# Patient Record
Sex: Female | Born: 1975 | Race: White | Hispanic: No | Marital: Single | Smoking: Never smoker
Health system: Southern US, Community
[De-identification: ages and names within clinical notes are randomized; demographics above are authoritative.]

## PROBLEM LIST (undated history)

## (undated) DIAGNOSIS — A64 Unspecified sexually transmitted disease: Secondary | ICD-10-CM

## (undated) DIAGNOSIS — J302 Other seasonal allergic rhinitis: Secondary | ICD-10-CM

## (undated) DIAGNOSIS — G43909 Migraine, unspecified, not intractable, without status migrainosus: Secondary | ICD-10-CM

## (undated) DIAGNOSIS — R87619 Unspecified abnormal cytological findings in specimens from cervix uteri: Secondary | ICD-10-CM

## (undated) DIAGNOSIS — IMO0002 Reserved for concepts with insufficient information to code with codable children: Secondary | ICD-10-CM

## (undated) HISTORY — DX: Other seasonal allergic rhinitis: J30.2

## (undated) HISTORY — DX: Reserved for concepts with insufficient information to code with codable children: IMO0002

## (undated) HISTORY — DX: Unspecified abnormal cytological findings in specimens from cervix uteri: R87.619

## (undated) HISTORY — DX: Unspecified sexually transmitted disease: A64

## (undated) HISTORY — PX: OTHER SURGICAL HISTORY: SHX169

## (undated) HISTORY — DX: Migraine, unspecified, not intractable, without status migrainosus: G43.909

## (undated) HISTORY — PX: BREAST SURGERY: SHX581

---

## 2008-04-27 HISTORY — PX: CERVICAL BIOPSY  W/ LOOP ELECTRODE EXCISION: SUR135

## 2008-05-25 ENCOUNTER — Emergency Department (HOSPITAL_COMMUNITY): Admission: EM | Admit: 2008-05-25 | Discharge: 2008-05-25 | Payer: Self-pay | Admitting: Emergency Medicine

## 2012-08-17 ENCOUNTER — Encounter: Payer: Self-pay | Admitting: Obstetrics

## 2012-08-19 ENCOUNTER — Ambulatory Visit (INDEPENDENT_AMBULATORY_CARE_PROVIDER_SITE_OTHER): Payer: BC Managed Care – PPO | Admitting: Obstetrics

## 2012-08-19 ENCOUNTER — Encounter: Payer: Self-pay | Admitting: Obstetrics

## 2012-08-19 VITALS — BP 119/82 | HR 102 | Temp 98.3°F | Ht 60.0 in | Wt 112.0 lb

## 2012-08-19 DIAGNOSIS — N6323 Unspecified lump in the left breast, lower outer quadrant: Secondary | ICD-10-CM

## 2012-08-19 DIAGNOSIS — N63 Unspecified lump in unspecified breast: Secondary | ICD-10-CM

## 2012-08-19 NOTE — Patient Instructions (Signed)
Management of breast lumps.

## 2012-08-19 NOTE — Progress Notes (Signed)
Subjective:     Marisa Hawkins is a 37 y.o. female here for a routine exam.  Current complaints: Patient c/o breast lump- noticed on Sunday- Left  4 o'clock size of a pea.  .  Patient is S/P Breast Reduction several years ago.  Has not been doing monthly self breast exams.   Gynecologic History Patient's last menstrual period was 08/08/2012. Contraception: OCP (estrogen/progesterone) Last Pap: 01/2012. Results were: normal Last mammogram: never.   Obstetric History OB History   Grav Para Term Preterm Abortions TAB SAB Ect Mult Living                   The following portions of the patient's history were reviewed and updated as appropriate: allergies, current medications, past family history, past medical history, past social history, past surgical history and problem list.  Review of Systems Pertinent items are noted in HPI.    Objective:    Breasts: Inspection negative, No nipple retraction or dimpling, No axillary or supraclavicular adenopathy, Taught monthly breast self examination, positive findings: irregular, firm, mobile and non-tender nodule located on the left lower outer quadrant    Assessment:    Breast lump.   Plan:    Education reviewed: self breast exams.    [facility]

## 2012-08-22 ENCOUNTER — Other Ambulatory Visit: Payer: Self-pay | Admitting: Obstetrics

## 2012-08-22 DIAGNOSIS — N63 Unspecified lump in unspecified breast: Secondary | ICD-10-CM

## 2012-09-01 ENCOUNTER — Ambulatory Visit: Payer: BC Managed Care – PPO | Admitting: Obstetrics

## 2012-09-08 ENCOUNTER — Other Ambulatory Visit: Payer: Self-pay

## 2012-09-08 ENCOUNTER — Ambulatory Visit
Admission: RE | Admit: 2012-09-08 | Discharge: 2012-09-08 | Disposition: A | Discharge status: Home / Self Care | Payer: BC Managed Care – PPO | Source: Ambulatory Visit | Attending: Obstetrics | Admitting: Obstetrics

## 2012-09-08 DIAGNOSIS — N63 Unspecified lump in unspecified breast: Secondary | ICD-10-CM

## 2012-09-20 ENCOUNTER — Ambulatory Visit (INDEPENDENT_AMBULATORY_CARE_PROVIDER_SITE_OTHER): Payer: BC Managed Care – PPO | Admitting: Obstetrics

## 2012-09-20 ENCOUNTER — Encounter: Payer: Self-pay | Admitting: Obstetrics

## 2012-09-20 VITALS — BP 102/67 | HR 89 | Temp 97.8°F | Ht 60.0 in | Wt 114.0 lb

## 2012-09-20 DIAGNOSIS — N63 Unspecified lump in unspecified breast: Secondary | ICD-10-CM

## 2012-09-20 NOTE — Progress Notes (Signed)
.   Subjective:     Marisa Hawkins is a 37 y.o. female here for a follow up exam regarding the lump in her left breast.  She had a mammogram on 09/08/12.  Personal health questionnaire reviewed: yes.   Gynecologic History Patient's last menstrual period was 09/10/2012. Contraception: OCP (estrogen/progesterone) Last Pap: 01/2012. Results were: normal Last mammogram: 09/08/2012 . Results were: palpable oil cyst  Obstetric History OB History   Grav Para Term Preterm Abortions TAB SAB Ect Mult Living                   The following portions of the patient's history were reviewed and updated as appropriate: allergies, current medications, past family history, past medical history, past social history, past surgical history and problem list.  Review of Systems Pertinent items are noted in HPI.    Objective:    No exam performed today, Consult only.    Assessment:    Breast lump.  Benign.   Plan:    Annual exam next visit.

## 2012-09-21 ENCOUNTER — Encounter: Payer: Self-pay | Admitting: Obstetrics

## 2013-03-09 ENCOUNTER — Ambulatory Visit: Payer: BC Managed Care – PPO | Admitting: Obstetrics

## 2013-03-17 ENCOUNTER — Encounter: Payer: Self-pay | Admitting: Advanced Practice Midwife

## 2013-03-17 ENCOUNTER — Ambulatory Visit (INDEPENDENT_AMBULATORY_CARE_PROVIDER_SITE_OTHER): Payer: BC Managed Care – PPO | Admitting: Advanced Practice Midwife

## 2013-03-17 VITALS — BP 112/75 | HR 88 | Temp 97.5°F | Ht 60.0 in | Wt 120.6 lb

## 2013-03-17 DIAGNOSIS — Z01419 Encounter for gynecological examination (general) (routine) without abnormal findings: Secondary | ICD-10-CM

## 2013-03-17 DIAGNOSIS — Z833 Family history of diabetes mellitus: Secondary | ICD-10-CM

## 2013-03-17 DIAGNOSIS — Z9889 Other specified postprocedural states: Secondary | ICD-10-CM

## 2013-03-17 LAB — COMPREHENSIVE METABOLIC PANEL
Alkaline Phosphatase: 46 U/L (ref 39–117)
BUN: 7 mg/dL (ref 6–23)
Glucose, Bld: 86 mg/dL (ref 70–99)
Total Bilirubin: 0.4 mg/dL (ref 0.3–1.2)

## 2013-03-17 LAB — CBC WITH DIFFERENTIAL/PLATELET
Basophils Relative: 0 % (ref 0–1)
Eosinophils Absolute: 0 10*3/uL (ref 0.0–0.7)
HCT: 40.9 % (ref 36.0–46.0)
Hemoglobin: 13.8 g/dL (ref 12.0–15.0)
MCH: 30.6 pg (ref 26.0–34.0)
MCHC: 33.7 g/dL (ref 30.0–36.0)
Monocytes Absolute: 0.3 10*3/uL (ref 0.1–1.0)
Monocytes Relative: 6 % (ref 3–12)
Neutrophils Relative %: 63 % (ref 43–77)

## 2013-03-17 LAB — HDL CHOLESTEROL: HDL: 98 mg/dL (ref >39)

## 2013-03-17 LAB — TRIGLYCERIDES: Triglycerides: 50 mg/dL (ref <150)

## 2013-03-17 LAB — CHOLESTEROL, TOTAL: Cholesterol: 208 mg/dL — ABNORMAL HIGH (ref 0–200)

## 2013-03-17 LAB — HEMOGLOBIN A1C
Hgb A1c MFr Bld: 5.4 % (ref <5.7)
Mean Plasma Glucose: 108 mg/dL (ref <117)

## 2013-03-17 MED ORDER — DESOGESTREL-ETHINYL ESTRADIOL 0.15-0.02/0.01 MG (21/5) PO TABS: 1.0000 | ORAL_TABLET | Freq: Every day | ORAL | Status: DC

## 2013-03-17 NOTE — Progress Notes (Signed)
  Subjective:     Marisa Hawkins is a 37 y.o. female and is here for a comprehensive physical exam. The patient reports no problems.  Patient denies risk of STI. Reports family history of heart disease and DM.   History   Social History  . Marital Status: Single    Spouse Name: N/A    Number of Children: N/A  . Years of Education: N/A   Occupational History  . Not on file.   Social History Main Topics  . Smoking status: Never Smoker   . Smokeless tobacco: Not on file  . Alcohol Use: Yes     Comment: rare  . Drug Use: No  . Sexual Activity: Yes    Partners: Male    Birth Control/ Protection: OCP   Other Topics Concern  . Not on file   Social History Narrative  . No narrative on file   Health Maintenance  Topic Date Due  . Pap Smear  12/05/1995  . Tetanus/tdap  12/04/1996  . Influenza Vaccine  11/25/2012    The following portions of the patient's history were reviewed and updated as appropriate: allergies, current medications, past family history, past medical history, past social history, past surgical history and problem list.  Review of Systems A comprehensive review of systems was negative.   Objective:    BP 112/75  Pulse 88  Temp(Src) 97.5 F (36.4 C) (Oral)  Ht 5' (1.524 m)  Wt 120 lb 9.6 oz (54.704 kg)  BMI 23.55 kg/m2  LMP 03/08/2013 General appearance: alert and cooperative Head: Normocephalic, without obvious abnormality, atraumatic Ears: normal TM's and external ear canals both ears Nose: Nares normal. Septum midline. Mucosa normal. No drainage or sinus tenderness. Throat: lips, mucosa, and tongue normal; teeth and gums normal Neck: no adenopathy, no carotid bruit, no JVD, supple, symmetrical, trachea midline and thyroid not enlarged, symmetric, no tenderness/mass/nodules Back: symmetric, no curvature. ROM normal. No CVA tenderness. Lungs: clear to auscultation bilaterally Breasts: normal appearance, no masses or tenderness palpated benign mass,  no change, non-tender. Scaring around breast from reduction. Heart: regular rate and rhythm, S1, S2 normal, no murmur, click, rub or gallop Abdomen: soft, non-tender; bowel sounds normal; no masses,  no organomegaly Pelvic: cervix normal in appearance, external genitalia normal, no adnexal masses or tenderness, no cervical motion tenderness, rectovaginal septum normal, uterus normal size, shape, and consistency and vagina normal without discharge Extremities: extremities normal, atraumatic, no cyanosis or edema Lymph nodes: Cervical, supraclavicular, and axillary nodes normal. Neurologic: Alert and oriented X 3, normal strength and tone. Normal symmetric reflexes. Normal coordination and gait    Assessment:    Healthy female exam.  BCM: OCP Patient Active Problem List   Diagnosis Date Noted  . History of loop electrical excision procedure (LEEP) 03/17/2013  . Breast lump on left side at 4 o'clock position 08/19/2012  . Lump or mass in breast 08/19/2012  Lump in breast is benign Family history of DM and Heart Disease      Plan:    Reviewed supplements. Encouraged Omega 3s, Vitamin D and Calcium. Preconception Counseling Reviewed Pap today w/ HPV General Health Panel of labs today See After Visit Summary for Counseling Recommendations  Encouraged regular fitness and exercise  50 min spent with patient greater than 80% spent in counseling and coordination of care.   Diondra Pines Wilson Singer CNM

## 2013-03-18 LAB — TSH: TSH: 1.807 u[IU]/mL (ref 0.350–4.500)

## 2013-03-21 LAB — PAP IG AND HPV HIGH-RISK

## 2016-04-03 ENCOUNTER — Ambulatory Visit (INDEPENDENT_AMBULATORY_CARE_PROVIDER_SITE_OTHER): Payer: 59 | Admitting: Obstetrics and Gynecology

## 2016-04-03 ENCOUNTER — Encounter: Payer: Self-pay | Admitting: Obstetrics and Gynecology

## 2016-04-03 VITALS — BP 108/76 | HR 68 | Resp 16 | Ht 59.75 in | Wt 127.0 lb

## 2016-04-03 DIAGNOSIS — Z01419 Encounter for gynecological examination (general) (routine) without abnormal findings: Secondary | ICD-10-CM | POA: Diagnosis not present

## 2016-04-03 DIAGNOSIS — N6320 Unspecified lump in the left breast, unspecified quadrant: Secondary | ICD-10-CM | POA: Diagnosis not present

## 2016-04-03 MED ORDER — ETONOGESTREL-ETHINYL ESTRADIOL 0.12-0.015 MG/24HR VA RING: 1.0000 | VAGINAL_RING | VAGINAL | 3 refills | Status: DC

## 2016-04-03 NOTE — Progress Notes (Signed)
40 y.o. G0P0000 Single Caucasian female here for annual exam.    Has used condoms and the sponge for contraception.   Used birth control in the past and did well.  Used Mircette.  Hx migraines with aura as a teen ago. Now has headaches the first day of her cycle but not migraines and no aura.  Hx LEEP and states she is told her cervix is short.   Not interested in childbearing.  Declines STD testing.   Not interested in lab work.  PCP:   None  Patient's last menstrual period was 03/26/2016 (exact date).     Period Cycle (Days): 30 Period Duration (Days): 5 Period Pattern: Regular Menstrual Flow: Light Menstrual Control: Tampon, Thin pad Menstrual Control Change Freq (Hours): every 6 hours on heaviest day Dysmenorrhea: (!) Mild Dysmenorrhea Symptoms: Cramping, Headache     Sexually active: Yes.   female The current method of family planning is condoms everytime or sponge.    Exercising: No.   Smoker:  no  Health Maintenance: Pap:  2014 normal History of abnormal Pap:  Yes, Hx colposcopy and LEEP procedure 2010--paps normal since. MMG:  2014 normal--this was done due to cyst in Left breast. Colonoscopy:  n/a BMD:   n/a  Result  n/a TDaP:  2013 Gardasil:   N/A   Screening Labs:  Hb today: declines, Urine today: unable to void   reports that she has never smoked. She has never used smokeless tobacco. She reports that she drinks about 1.8 oz of alcohol per week . She reports that she does not use drugs.  Past Medical History:  Diagnosis Date  . Abnormal Pap smear    Hx colposcopy and LEEP procedure 2010--Pos HR HPV  . Migraines    Migraines as a teenager with aura  . Seasonal allergies   . 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 BREAST SURGERY     breast reduction  . CERVICAL BIOPSY  W/ LOOP ELECTRODE EXCISION  2010  . colposcopy and LEEP      No current outpatient prescriptions on file.    No current facility-administered medications for this visit.     Family History  Problem Relation Age of Onset  . Arthritis Mother   . Diabetes Mother   . Hyperlipidemia Mother   . Osteoarthritis Mother   . Heart disease Maternal Grandmother   . Cancer Maternal Grandfather   . Mental illness Paternal Grandmother   . Heart disease Paternal Grandfather     ROS:  Pertinent items are noted in HPI.  Otherwise, a comprehensive ROS was negative.  Exam:   BP 108/76 (BP Location: Right Arm, Patient Position: Sitting, Cuff Size: Normal)   Pulse 68   Resp 16   Ht 4' 11.75" (1.518 m)   Wt 127 lb (57.6 kg)   LMP 03/26/2016 (Exact Date)   BMI 25.01 kg/m     General appearance: alert, cooperative and appears stated age Head: Normocephalic, without obvious abnormality, atraumatic Neck: no adenopathy, supple, symmetrical, trachea midline and thyroid normal to inspection and palpation Lungs: clear to auscultation bilaterally Breasts: Consistent with bilateral reduction.  Left breast with 4 mm firm pea size nodule at 4- 5:00. Right breast normal.  Bilateral reduction. No tenderness, No nipple retraction or dimpling, No nipple discharge or bleeding, No axillary or supraclavicular adenopathy Heart: regular rate and rhythm Abdomen: soft, non-tender; no masses, no organomegaly Extremities: extremities  normal, atraumatic, no cyanosis or edema Skin: Skin color, texture, turgor normal. No rashes or lesions Lymph nodes: Cervical, supraclavicular, and axillary nodes normal. No abnormal inguinal nodes palpated Neurologic: Grossly normal  Pelvic: External genitalia:  no lesions              Urethra:  normal appearing urethra with no masses, tenderness or lesions              Bartholins and Skenes: normal                 Vagina: normal appearing vagina with normal color and discharge, no lesions              Cervix: no lesions              Pap taken: Yes.   Bimanual Exam:  Uterus:  normal size,  contour, position, consistency, mobility, non-tender              Adnexa: no mass, fullness, tenderness      Chaperone was present for exam.  Assessment:   Well woman visit with normal exam. Hx LEEP and positive HR HPV.  Hx migraines with aura as a teen. None since.  Left breast mass.  Hx bilateral breast reduction.   Plan: Dx bilateral mammogram and left breast ultrasound.  Recommended self breast exam.  Pap and HR HPV as above. Discussed Calcium, Vitamin D, regular exercise program including cardiovascular and weight bearing exercise. Rx for NuvaRing 3 with 3 refills. Instructed in use.  OK to use each ring for 28 days if desires amenorrhea. Follow up annually and prn.       After visit summary provided.

## 2016-04-03 NOTE — Patient Instructions (Signed)

## 2016-04-03 NOTE — Progress Notes (Signed)
Scheduled patient while in office for bilateral diagnostic mammogram [facility] on 04[facility]24/2016 at 2 pm. Patient is agreeable to date and time. Placed in mammogram hold.

## 2016-04-10 LAB — IPS PAP TEST WITH HPV

## 2016-04-24 ENCOUNTER — Other Ambulatory Visit: Payer: Self-pay

## 2016-05-01 ENCOUNTER — Ambulatory Visit
Admission: RE | Admit: 2016-05-01 | Discharge: 2016-05-01 | Disposition: A | Discharge status: Home / Self Care | Payer: 59 | Source: Ambulatory Visit | Attending: Obstetrics and Gynecology | Admitting: Obstetrics and Gynecology

## 2016-05-01 DIAGNOSIS — N6320 Unspecified lump in the left breast, unspecified quadrant: Secondary | ICD-10-CM

## 2017-03-01 ENCOUNTER — Other Ambulatory Visit: Payer: Self-pay | Admitting: Obstetrics and Gynecology

## 2017-03-01 NOTE — Telephone Encounter (Signed)
Medication refill request: Nuvaring  Last AEX:  04-03-16  Next AEX: 04-09-17  Last MMG (if hormonal medication request): 05-01-16 Stable 3 mm palpable subcutaneous oil cyst in the lower outer quadrant of the left breast. No evidence of malignancy bilaterally Refill authorized: please advise

## 2017-04-09 ENCOUNTER — Ambulatory Visit: Payer: 59 | Admitting: Obstetrics and Gynecology

## 2017-04-23 ENCOUNTER — Encounter: Payer: Self-pay | Admitting: Certified Nurse Midwife

## 2017-04-23 ENCOUNTER — Other Ambulatory Visit: Payer: Self-pay

## 2017-04-23 ENCOUNTER — Ambulatory Visit: Payer: 59 | Admitting: Certified Nurse Midwife

## 2017-04-23 VITALS — BP 108/68 | HR 82 | Resp 14 | Ht 59.75 in | Wt 135.0 lb

## 2017-04-23 DIAGNOSIS — Z01419 Encounter for gynecological examination (general) (routine) without abnormal findings: Secondary | ICD-10-CM | POA: Diagnosis not present

## 2017-04-23 DIAGNOSIS — Z3044 Encounter for surveillance of vaginal ring hormonal contraceptive device: Secondary | ICD-10-CM | POA: Diagnosis not present

## 2017-04-23 NOTE — Patient Instructions (Signed)

## 2017-04-23 NOTE — Progress Notes (Signed)
41 y.o. G0P0000 Single  Caucasian Fe here for annual exam. Nuvaring working well with continuous use for convenience. Some spotting at times, but not consistent. No partner change, no STD concerns or testing today.  Has not noted lump (oil cyst) in left breast recently. Does monthly SBE. Sees Urgent care if needed. No health issues today.  No LMP recorded. Patient is not currently having periods (Reason: Other).          Sexually active: Yes.    The current method of family planning is NuvaRing vaginal inserts.    Exercising: No.  The patient does not participate in regular exercise at present. Smoker:  no  Health Maintenance: Pap:  2014 neg, 04-03-16 neg HPV HR neg History of Abnormal Pap: yes with LEEP MMG:  05-01-16 category b density birads 2:neg left breast mass unchanged since 2014 Self Breast exams: yes Colonoscopy:  none BMD:   none TDaP:  2013 Shingles: never Pneumonia: never Hep C and HIV: never Labs: declined, wants to establish with PCP   reports that  has never smoked. she has never used smokeless tobacco. She reports that she drinks about 1.8 oz of alcohol per week. She reports that she does not use drugs.  Past Medical History:  Diagnosis Date  . Abnormal Pap smear    Hx colposcopy and LEEP procedure 2010--Pos HR HPV  . Migraines    Migraines as a teenager with aura  . Seasonal allergies   . 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 BREAST SURGERY     breast reduction  . CERVICAL BIOPSY  W/ LOOP ELECTRODE EXCISION  2010  . colposcopy and LEEP      Current Outpatient Medications  Medication Sig Dispense Refill  . NUVARING 0.12-0.015 MG/24HR vaginal ring PLACE 1 RING VAGINALLY AND LEAVE IN PLACE FOR 3 WEEKS THEN REMOVE FOR 1 WEEK 1 each 1   No current facility-administered medications for this visit.     Family History  Problem Relation Age of Onset  . Arthritis Mother   . Diabetes Mother   .  Hyperlipidemia Mother   . Osteoarthritis Mother   . Heart disease Maternal Grandmother   . Cancer Maternal Grandfather   . Mental illness Paternal Grandmother   . Heart disease Paternal Grandfather     ROS:  Pertinent items are noted in HPI.  Otherwise, a comprehensive ROS was negative.  Exam:   BP 108/68 (BP Location: Right Arm, Patient Position: Sitting, Cuff Size: Normal)   Pulse 82   Resp 14   Ht 4' 11.75" (1.518 m)   Wt 135 lb (61.2 kg)   BMI 26.59 kg/m  Height: 4' 11.75" (151.8 cm) Ht Readings from Last 3 Encounters:  04/23/17 4' 11.75" (1.518 m)  04/03/16 4' 11.75" (1.518 m)  03/17/13 5' (1.524 m)    General appearance: alert, cooperative and appears stated age Head: Normocephalic, without obvious abnormality, atraumatic Neck: no adenopathy, supple, symmetrical, trachea midline and thyroid normal to inspection and palpation Lungs: clear to auscultation bilaterally Breasts: normal appearance, no masses or tenderness, No nipple retraction or dimpling, No nipple discharge or bleeding, No axillary or supraclavicular adenopathy  No masses noted in  Previous area of concern and patient has not been able to feel either. Heart: regular rate and rhythm Abdomen: soft, non-tender; no masses,  no organomegaly Extremities: extremities normal, atraumatic, no cyanosis or edema Skin: Skin color, texture, turgor  normal. No rashes or lesions Lymph nodes: Cervical, supraclavicular, and axillary nodes normal. No abnormal inguinal nodes palpated Neurologic: Grossly normal   Pelvic: External genitalia:  no lesions              Urethra:  normal appearing urethra with no masses, tenderness or lesions              Bartholin's and Skene's: normal                 Vagina: normal appearing vagina with normal color and discharge, no lesions              Cervix: no cervical motion tenderness, no lesions and LEEP appearance              Pap taken: No. Bimanual Exam:  Uterus:  normal size, contour,  position, consistency, mobility, non-tender              Adnexa: normal adnexa and no mass, fullness, tenderness               Rectovaginal: Confirms               Anus:  normal sphincter tone, no lesions  Chaperone present: yes  A:  Well Woman with normal exam  Contraception Nuvaring desired  History of abnormal pap smear with LEEP  History of oil gland cyst in left breast not palpable today   P:   Reviewed health and wellness pertinent to exam  Discussed risks/benefits/warning signs of Nuvaring , desires continuance. Discussed having menstrual cycle every 3 rd month to see if BTB stops and will advise.  Rx Nuvaring see order with instructions.  Continue SBE and advise if any changes noted. Stressed mammogram yearly.  Pap smear: no   counseled on breast self exam, mammography screening, STD prevention, HIV risk factors and prevention, adequate intake of calcium and vitamin D, diet and exercise  return annually or prn  An After Visit Summary was printed and given to the patient.

## 2017-04-26 ENCOUNTER — Other Ambulatory Visit: Payer: Self-pay | Admitting: *Deleted

## 2017-04-26 NOTE — Telephone Encounter (Signed)
Medication refill request: nuvaring  Last AEX:  04/23/17 DL  Next AEX: 1/61/091/10/20 Last MMG (if hormonal medication request): 05-01-16 category b density birads 2:neg left breast mass unchanged since 2014 Refill authorized: 03/01/17 #1, 1RF. Today, please advise.    Detailed message left for patient to return call to verify which pharmacy she needed this medication sent to.

## 2017-04-28 MED ORDER — ETONOGESTREL-ETHINYL ESTRADIOL 0.12-0.015 MG/24HR VA RING: 1.0000 | VAGINAL_RING | VAGINAL | 11 refills | Status: DC

## 2017-04-28 NOTE — Telephone Encounter (Signed)
Spoke with patient. [facility] [address]

## 2017-07-05 ENCOUNTER — Telehealth: Payer: Self-pay | Admitting: Certified Nurse Midwife

## 2017-07-05 NOTE — Telephone Encounter (Signed)
Spoke with patient, advised as seen below per Dr. Edward JollySilva. Patient will leave nuvaring out until OV with Leota Sauerseborah Leonard, CNM. Is aware to use BUM if SA. Patient verbalizes understanding and is agreeable.   Routing to provider for final review. Patient is agreeable to disposition. Will close encounter.

## 2017-07-05 NOTE — Telephone Encounter (Signed)
Spoke with patient. Has been using Nuvaring for 4337yr. Experienced "aura with spotting and flash" this afternoon, no headache. Hx of migraines as a teenager 27 yrs ago. Denies any other GYN symptoms.   Symptoms have resolved, patient removed her Nuvaring 20 minutes ago, "was freaked out". OV scheduled for 3/15 at 10:30am with Leota Sauerseborah Leonard, CNM. Patient is SA, advised to use BUM. Advised patient will review with covering provider and return call with recommendations, patient is agreeable.   Dr. Edward JollySilva -please review and advise?  Cc: Leota Sauerseborah Leonard, CNM

## 2017-07-05 NOTE — Telephone Encounter (Signed)
I agree with removal of the Nuvaring.  Eunice BlaseDebbie can assess this further with her at her office visit.  She can decide if she needs to be seen by Neurology.   Cc- Sara Chuebbie Leonard

## 2017-07-05 NOTE — Telephone Encounter (Signed)
Patient was told to call if she started having migraines on Nuvoring.

## 2017-07-09 ENCOUNTER — Other Ambulatory Visit: Payer: Self-pay

## 2017-07-09 ENCOUNTER — Encounter: Payer: Self-pay | Admitting: Certified Nurse Midwife

## 2017-07-09 ENCOUNTER — Ambulatory Visit (INDEPENDENT_AMBULATORY_CARE_PROVIDER_SITE_OTHER): Payer: 59 | Admitting: Certified Nurse Midwife

## 2017-07-09 VITALS — BP 102/70 | HR 68 | Resp 16 | Ht 59.75 in | Wt 131.0 lb

## 2017-07-09 DIAGNOSIS — Z3041 Encounter for surveillance of contraceptive pills: Secondary | ICD-10-CM | POA: Diagnosis not present

## 2017-07-09 DIAGNOSIS — Z8669 Personal history of other diseases of the nervous system and sense organs: Secondary | ICD-10-CM

## 2017-07-09 MED ORDER — NORETHINDRONE 0.35 MG PO TABS: 1.0000 | ORAL_TABLET | Freq: Every day | ORAL | 4 refills | Status: DC

## 2017-07-09 NOTE — Patient Instructions (Signed)
Call back in month and give status report and if needed earllier

## 2017-07-09 NOTE — Progress Notes (Signed)
42 y.o. Single Caucasian G0P0000 here for evaluation of Nuvaring use started  more than year ago. History of migraine and aura as teen and had not had again. Patient experienced Aura only for about 10 minutes 2 weeks ago at work. Finally realized what it was,due to no occurrence in so long. Did not have headache with. Denied any numbness or speech issues with. 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 Here for contraception and aware progesterone only recommended. Patient has read information and prefers POP. Plans follow up with MD if aura occurs again. No other concerns today.  O: Healthy female, WD WN Affect: normal orientation X 3    A: History of Migraine headache with aura since teen, aura occurred recently with Nuvaring use, has discontinued with no sequelae. Contraception change desires POP  P: Discussed importance of aura follow up if there are reoccurrence and risk of stroke. Patient will follow up. Risks/benefits/warning signs/bleeding expectations with POP given. Requests Rx Rx Micronor see order with instructions and need to evaluate in 1 month after use or if problems. Start with next menses.Questions addressed.   18 minutes spent  of time spent in face to face counseling regarding contraception options and use.  RV prn as above

## 2018-05-06 ENCOUNTER — Other Ambulatory Visit: Payer: Self-pay

## 2018-05-06 ENCOUNTER — Encounter: Payer: Self-pay | Admitting: Certified Nurse Midwife

## 2018-05-06 ENCOUNTER — Ambulatory Visit: Payer: 59 | Admitting: Certified Nurse Midwife

## 2018-05-06 ENCOUNTER — Other Ambulatory Visit (HOSPITAL_COMMUNITY)
Admission: RE | Admit: 2018-05-06 | Discharge: 2018-05-06 | Disposition: A | Discharge status: Home / Self Care | Payer: 59 | Source: Ambulatory Visit | Attending: Obstetrics & Gynecology | Admitting: Obstetrics & Gynecology

## 2018-05-06 VITALS — BP 110/68 | HR 60 | Resp 16 | Ht 60.25 in | Wt 134.0 lb

## 2018-05-06 DIAGNOSIS — Z124 Encounter for screening for malignant neoplasm of cervix: Secondary | ICD-10-CM

## 2018-05-06 DIAGNOSIS — Z3041 Encounter for surveillance of contraceptive pills: Secondary | ICD-10-CM

## 2018-05-06 DIAGNOSIS — Z8742 Personal history of other diseases of the female genital tract: Secondary | ICD-10-CM

## 2018-05-06 DIAGNOSIS — Z01419 Encounter for gynecological examination (general) (routine) without abnormal findings: Secondary | ICD-10-CM

## 2018-05-06 MED ORDER — NORETHINDRONE 0.35 MG PO TABS: 1.0000 | ORAL_TABLET | Freq: Every day | ORAL | 4 refills | Status: DC

## 2018-05-06 NOTE — Progress Notes (Signed)
43 y.o. G0P0000 Single  Caucasian Fe here for annual exam. Periods scant to every 3 month for about one day to none with POP use. No other aura occurrence. Working well. No partner change or STD screening needed. No health changes. Declines labs today unless needed.   No LMP recorded. (Menstrual status: Oral contraceptives).          Sexually active: Yes.    The current method of family planning is oral progesterone-only contraceptive.    Exercising: No.  exercise Smoker:  no  Review of Systems  Constitutional: Negative.   HENT: Negative.   Eyes: Negative.   Respiratory: Negative.   Cardiovascular: Negative.   Gastrointestinal: Negative.   Genitourinary: Negative.   Musculoskeletal: Negative.   Skin: Negative.   Neurological: Negative.   Endo/Heme/Allergies: Negative.   Psychiatric/Behavioral: Negative.     Health Maintenance: Pap:  04-03-16 HPV HR neg HPV HR neg History of Abnormal Pap: yes LEEP MMG:  05-01-16 category b density birads 2:neg Self Breast exams: occ Colonoscopy:  none BMD:   none TDaP:  2013 Shingles: no Pneumonia: no Hep C and HIV: not done Labs: if needed.   reports that she has never smoked. She has never used smokeless tobacco. She reports current alcohol use of about 3.0 standard drinks of alcohol per week. She reports that she does not use drugs.  Past Medical History:  Diagnosis Date  . Abnormal Pap smear    Hx colposcopy and LEEP procedure 2010--Pos HR HPV  . Migraines    Migraines as a teenager with aura  . Seasonal allergies   . 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 BREAST SURGERY     breast reduction  . CERVICAL BIOPSY  W/ LOOP ELECTRODE EXCISION  2010  . colposcopy and LEEP      Current Outpatient Medications  Medication Sig Dispense Refill  . norethindrone (MICRONOR,CAMILA,ERRIN) 0.35 MG tablet Take 1 tablet (0.35 mg total) by mouth daily. 3 Package 4   No current  facility-administered medications for this visit.     Family History  Problem Relation Age of Onset  . Arthritis Mother   . Diabetes Mother   . Hyperlipidemia Mother   . Osteoarthritis Mother   . Heart disease Maternal Grandmother   . Cancer Maternal Grandfather   . Mental illness Paternal Grandmother   . Heart disease Paternal Grandfather     ROS:  Pertinent items are noted in HPI.  Otherwise, a comprehensive ROS was negative.  Exam:   BP 110/68   Pulse 60   Resp 16   Ht 5' 0.25" (1.53 m)   Wt 134 lb (60.8 kg)   BMI 25.95 kg/m  Height: 5' 0.25" (153 cm) Ht Readings from Last 3 Encounters:  05/06/18 5' 0.25" (1.53 m)  07/09/17 4' 11.75" (1.518 m)  04/23/17 4' 11.75" (1.518 m)    General appearance: alert, cooperative and appears stated age Head: Normocephalic, without obvious abnormality, atraumatic Neck: no adenopathy, supple, symmetrical, trachea midline and thyroid normal to inspection and palpation Lungs: clear to auscultation bilaterally Breasts: normal appearance, no masses or tenderness, No nipple retraction or dimpling, No nipple discharge or bleeding, No axillary or supraclavicular adenopathy scarring from breast reduction only Heart: regular rate and rhythm Abdomen: soft, non-tender; no masses,  no organomegaly Extremities: extremities normal, atraumatic, no cyanosis or edema Skin: Skin color, texture, turgor normal. No rashes or lesions Lymph nodes:  Cervical, supraclavicular, and axillary nodes normal. No abnormal inguinal nodes palpated Neurologic: Grossly normal   Pelvic: External genitalia:  no lesions              Urethra:  normal appearing urethra with no masses, tenderness or lesions              Bartholin's and Skene's: normal                 Vagina: normal appearing vagina with normal color and discharge, no lesions              Cervix: no cervical motion tenderness, no lesions and nulliparous appearance              Pap taken: Yes.   Bimanual  Exam:  Uterus:  normal size, contour, position, consistency, mobility, non-tender and anteverted              Adnexa: normal adnexa and no mass, fullness, tenderness               Rectovaginal: Confirms               Anus:  normal sphincter tone, no lesions  Chaperone present: yes  A:  Well Woman with normal exam  Contraception POP working well, using due to aura with migraines  History of LEEP with abnormal pap smear history  Mammogram due, patient to schedule  P:   Reviewed health and wellness pertinent to exam  Risks/benefits/warning signs with POP given. Desires continuance   Pap smear: yes   counseled on breast self exam, mammography screening, STD prevention, HIV risk factors and prevention, adequate intake of calcium and vitamin D, diet and exercise return annually or prn  An After Visit Summary was printed and given to the patient.

## 2018-05-06 NOTE — Addendum Note (Signed)
Addended by: Verner Chol on: 05/06/2018 03:37 PM   Modules accepted: Orders

## 2018-05-06 NOTE — Patient Instructions (Signed)

## 2018-05-10 LAB — CYTOLOGY - PAP: HPV: DETECTED — AB

## 2018-05-11 ENCOUNTER — Telehealth: Payer: Self-pay | Admitting: *Deleted

## 2018-05-11 DIAGNOSIS — R87810 Cervical high risk human papillomavirus (HPV) DNA test positive: Principal | ICD-10-CM

## 2018-05-11 DIAGNOSIS — R87610 Atypical squamous cells of undetermined significance on cytologic smear of cervix (ASC-US): Secondary | ICD-10-CM

## 2018-05-11 DIAGNOSIS — Z8742 Personal history of other diseases of the female genital tract: Secondary | ICD-10-CM

## 2018-05-11 NOTE — Telephone Encounter (Signed)
Ok to schedule with me

## 2018-05-11 NOTE — Telephone Encounter (Signed)
Noted message.  Encounter closed.  Cc Suzy Dixon/Rosa Davis .

## 2018-05-11 NOTE — Telephone Encounter (Signed)
Notes recorded by Leda Min, RN on 05/11/2018 at 11:33 AM EST Left message to call Noreene Larsson, RN at [facility] [phone].

## 2018-05-11 NOTE — Telephone Encounter (Signed)
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 She had previous abnormal in past with LEEP. Needs to be scheduled for colposcopy.

## 2018-05-11 NOTE — Telephone Encounter (Signed)
Spoke with patient and results given.  Agreeable to colposcopy. Instructions given.  No menses with Micronor per patient. Advised to call back if has any concern regarding pregnancy or bleeding.  Colposcopy scheduled with Leota Sauers CNM on 05/20/2018.   Debbi okay to keep as scheduled with you or needs to be scheduled with MD?

## 2018-05-12 ENCOUNTER — Telehealth: Payer: Self-pay | Admitting: Certified Nurse Midwife

## 2018-05-12 NOTE — Telephone Encounter (Signed)
Patient call to reschedule appointment for a colposcopy on 05/20/2018. Patient is rescheduled for 05/27/2018 with Leota Sauerseborah Leonard, CNM. Patient is aware of appointment date, arrival time and cancellation policy. Will close encounter  Routing to Leota Sauerseborah Leonard, CNM  cc: Almedia Ballsracy Fast

## 2018-05-20 ENCOUNTER — Ambulatory Visit: Payer: Self-pay | Admitting: Certified Nurse Midwife

## 2018-05-27 ENCOUNTER — Other Ambulatory Visit: Payer: Self-pay

## 2018-05-27 ENCOUNTER — Ambulatory Visit: Payer: 59 | Admitting: Certified Nurse Midwife

## 2018-05-27 ENCOUNTER — Encounter: Payer: Self-pay | Admitting: Certified Nurse Midwife

## 2018-05-27 VITALS — BP 110/66 | HR 68 | Resp 16 | Wt 134.0 lb

## 2018-05-27 DIAGNOSIS — R87612 Low grade squamous intraepithelial lesion on cytologic smear of cervix (LGSIL): Secondary | ICD-10-CM

## 2018-05-27 DIAGNOSIS — Z8742 Personal history of other diseases of the female genital tract: Secondary | ICD-10-CM

## 2018-05-27 NOTE — Patient Instructions (Signed)

## 2018-05-27 NOTE — Progress Notes (Signed)
05-06-2018 LGSIL HPV HR + Previous LEEP done Patient took 1 naproxen at 12:30pm

## 2018-05-27 NOTE — Progress Notes (Addendum)
Patient ID: Marisa Hawkins, female   DOB: 03-Nov-1975, 43 y.o.   MRN: [id number]  Chief Complaint  Patient presents with  . Colposcopy    //jj    HPI Marisa Hawkins is a 43 y.o.white married [id number] female here for colposcopy exam. Denies pelvic pain or vaginal bleeding. Contraception POP.   Indications: Pap smear on May 06 2018 showed: low-grade squamous intraepithelial neoplasia (LGSIL - encompassing HPV,mild dysplasia,CIN I).  Prior cervical treatment: cervical biopsy and LEEP in 2010.  Past Medical History:  Diagnosis Date  . Abnormal Pap smear    Hx colposcopy and LEEP procedure 2010--Pos HR HPV, 05-06-2018 LGSIL HPV HR+  . Migraines    Migraines as a teenager with aura  . Seasonal allergies   . 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 BREAST SURGERY     breast reduction  . CERVICAL BIOPSY  W/ LOOP ELECTRODE EXCISION  2010  . colposcopy and LEEP      Family History  Problem Relation Age of Onset  . Arthritis Mother   . Diabetes Mother   . Hyperlipidemia Mother   . Osteoarthritis Mother   . Heart disease Maternal Grandmother   . Cancer Maternal Grandfather   . Mental illness Paternal Grandmother   . Heart disease Paternal Grandfather     Social History Social History   Tobacco Use  . Smoking status: Never Smoker  . Smokeless tobacco: Never Used  Substance Use Topics  . Alcohol use: Yes    Alcohol/week: 3.0 standard drinks    Types: 3 Standard drinks or equivalent per week  . Drug use: No    No Known Allergies  Current Outpatient Medications  Medication Sig Dispense Refill  . norethindrone (MICRONOR,CAMILA,ERRIN) 0.35 MG tablet Take 1 tablet (0.35 mg total) by mouth daily. 3 Package 4   No current facility-administered medications for this visit.     Review of Systems Review of Systems  Constitutional: Negative.   HENT: Negative.   Eyes: Negative.   Respiratory: Negative.     Cardiovascular: Negative.   Gastrointestinal: Negative.  Negative for abdominal pain.  Endocrine: Negative.   Genitourinary: Negative.  Negative for genital sores, pelvic pain, vaginal bleeding, vaginal discharge and vaginal pain.  Musculoskeletal: Negative.   Skin: Negative.   Allergic/Immunologic: Negative.   Neurological: Negative.   Hematological: Negative.   Psychiatric/Behavioral: Negative.     Blood pressure 110/66, pulse 68, resp. rate 16, weight 134 lb (60.8 kg).  Physical Exam Physical Exam Constitutional:      Appearance: Normal appearance.  Cardiovascular:     Rate and Rhythm: Normal rate.  Pulmonary:     Effort: Pulmonary effort is normal.  Genitourinary:    General: Normal vulva.     Exam position: Lithotomy position.     Labia:        Right: No rash, tenderness or lesion.        Left: No rash, tenderness or lesion.      Cervix: No cervical motion tenderness, discharge or cervical bleeding.     Lymphadenopathy:     Lower Body: No right inguinal adenopathy. No left inguinal adenopathy.  Neurological:     Mental Status: She is alert and oriented to person, place, and time.  Psychiatric:        Mood and Affect: Mood normal.        Behavior: Behavior normal.  Thought Content: Thought content normal.        Judgment: Judgment normal.     Data Reviewed Reviewed pap smear finding and questions addressed.   Assessment    Procedure Details  The risks and benefits of the procedure and Written informed consent obtained.  Speculum placed in vagina and excellent visualization of cervix achieved, cervix swabbed x 3 with saline and acetic acid solution. Cervix viewed with 3.75,7.0, 15# and green filter. Acetowhite area noted at 2 o'clock, Lugol's solution applied with light staining noted in the area only. Biopsy taken. ECC obtained, noted LEEP appearance. Monsel's applied with no bleeding noted upon removal of speculum. Patient tolerated procedure well.  Written and printed instructions given. Patient escorted to check out in good condition,  Specimens: 2  Complications: none.     Plan    Specimens labelled and sent to Pathology. Patient will be called with pathology results when in. Patient notified of cervical biopsy showed benign squamous mucosa and no dysplasia or malignancy. Ecc showed benign endocervical epithelium.  Will need repeat pap in 6 months due LSIL + HPV pap finding. 06 recall placed.  Marisa Hawkins 05/27/2018, 4:24 PM

## 2018-06-03 ENCOUNTER — Telehealth: Payer: Self-pay | Admitting: *Deleted

## 2018-06-03 NOTE — Telephone Encounter (Signed)
-----   Message from Verner Chol, CNM sent at 06/02/2018  9:32 AM EST ----- Notify patient that her cervical biopsy showed benign squamous mucosa, no dysplasia or malignancy ECC showed benign endocervical epithelium She needs repeat pap smear in 6 months due to LSIL + HPV  finding. 06 recall please schedule

## 2018-06-03 NOTE — Telephone Encounter (Signed)
Notes recorded by Leda MinHamm, Yadir Zentner N, RN on 06/03/2018 at 11:55 AM EST Left message to call N[facility] LarssonJill, [facility] [phone].   06 recall placed

## 2018-06-06 NOTE — Telephone Encounter (Signed)
Patient returned call to nurse Jill. °

## 2018-06-06 NOTE — Telephone Encounter (Signed)
Spoke with patient, advised as seen below per Leota Sauers, CNM. OV scheduled for 12/02/18 at 10am with Leota Sauers, CNM.  Patient verbalizes understanding and is agreeable.   Encounter closed.

## 2018-12-02 ENCOUNTER — Ambulatory Visit: Payer: Self-pay | Admitting: Certified Nurse Midwife

## 2018-12-15 ENCOUNTER — Other Ambulatory Visit: Payer: Self-pay

## 2018-12-16 ENCOUNTER — Encounter: Payer: Self-pay | Admitting: Certified Nurse Midwife

## 2018-12-16 ENCOUNTER — Other Ambulatory Visit: Payer: Self-pay

## 2018-12-16 ENCOUNTER — Other Ambulatory Visit (HOSPITAL_COMMUNITY)
Admission: RE | Admit: 2018-12-16 | Discharge: 2018-12-16 | Disposition: A | Discharge status: Home / Self Care | Payer: 59 | Source: Ambulatory Visit | Attending: Certified Nurse Midwife | Admitting: Certified Nurse Midwife

## 2018-12-16 ENCOUNTER — Ambulatory Visit: Payer: 59 | Admitting: Certified Nurse Midwife

## 2018-12-16 VITALS — BP 110/70 | HR 68 | Temp 97.2°F | Resp 16 | Wt 139.0 lb

## 2018-12-16 DIAGNOSIS — Z8742 Personal history of other diseases of the female genital tract: Secondary | ICD-10-CM

## 2018-12-16 DIAGNOSIS — Z124 Encounter for screening for malignant neoplasm of cervix: Secondary | ICD-10-CM | POA: Insufficient documentation

## 2018-12-16 NOTE — Progress Notes (Signed)
Review of Systems  Constitutional: Negative.   HENT: Negative.   Eyes: Negative.   Respiratory: Negative.   Cardiovascular: Negative.   Skin: Negative.     .43 y.o. G0P0000 Single Caucasian  F here for repeat Pap smear due to history of low-grade squamous intraepithelial neoplasia (LGSIL - encompassing HPV,mild dysplasia,CIN I) on pap done 05/06/2018 and benign squamous mucosa, no dysplasia ,ECC showed benign endocervical epithelium on colpo done 05/27/2018. Patient periods normal, no issues. Contraception OCP. No partner change. Today she denies vaginal symptoms or STD concerns.  No LMP recorded. (Menstrual status: Oral contraceptives)..  Contraception: Micronor  EXAM: General appearance:  WNWD Female, NAD Pelvic exam: VULVA: normal appearing vulva with no masses, tenderness or lesions, VAGINA: normal appearing vagina with normal color and discharge, no lesions, CERVIX: normal appearing cervix without discharge or lesions, UTERUS: uterus is normal size, shape, consistency and nontender, anteverted, ADNEXA: normal adnexa in size, nontender and no masses, RECTAL: normal appearance. Pap smear obtained.  Assessment: History of low-grade squamous intraepithelial neoplasia (LGSIL - encompassing HPV,mild dysplasia,CIN I)  Colposcopy finding not consistent with pap smear finding.  Plan: Repeat Pap 6 months at aex unless otherwise indicated by results. Discussed importance of follow up as indicated. Questions addressed.   Rv 6 moths with aex

## 2018-12-20 LAB — CYTOLOGY - PAP
Adequacy: ABSENT — AB
Diagnosis: UNDETERMINED — AB
HPV: DETECTED — AB

## 2019-05-10 NOTE — Progress Notes (Signed)
44 y.o. G0P0000 Single  Caucasian Fe here for annual exam. Periods occasional spotting with POP use. Denies any warning signs with use.  Sexually active, no partner change or STD screening needed. 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 Has taken first Covid vaccine with no issues. Sees Urgent care if needed. No other health issues today.  No LMP recorded. (Menstrual status: Oral contraceptives).          Sexually active: Yes.    The current method of family planning is oral progesterone-only contraceptive.    Exercising: No.  exercise Smoker:  no  Review of Systems  Constitutional: Negative.   HENT: Negative.   Eyes: Negative.   Respiratory: Negative.   Cardiovascular: Negative.   Gastrointestinal: Negative.   Genitourinary: Negative.   Musculoskeletal: Negative.   Skin: Negative.   Neurological: Negative.   Endo/Heme/Allergies: Negative.   Psychiatric/Behavioral: Negative.     Health Maintenance: Pap:  04-03-16 neg HPV HR neg, 05-16-2018 LGSIL HPV HR+, 12-16-2018 ASCUS HPV HR + History of Abnormal Pap: LEEP MMG:  05-01-16 category b density birads 2:neg Self Breast exams: yes Colonoscopy: none BMD:   none TDaP:  2013 Shingles: no Pneumonia: no Hep C and HIV: not done Labs: no   reports that she has never smoked. She has never used smokeless tobacco. She reports current alcohol use of about 3.0 standard drinks of alcohol per week. She reports that she does not use drugs.  Past Medical History:  Diagnosis Date  . Abnormal Pap smear    Hx colposcopy and LEEP procedure 2010--Pos HR HPV, 05-06-2018 LGSIL HPV HR+  . Migraines    Migraines as a teenager with aura  . Seasonal allergies   . 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 BREAST SURGERY     breast reduction  . CERVICAL BIOPSY  W/ LOOP ELECTRODE EXCISION  2010  . colposcopy and LEEP      Current Outpatient Medications  Medication Sig Dispense  Refill  . norethindrone (MICRONOR,CAMILA,ERRIN) 0.35 MG tablet Take 1 tablet (0.35 mg total) by mouth daily. 3 Package 4   No current facility-administered medications for this visit.    Family History  Problem Relation Age of Onset  . Arthritis Mother   . Diabetes Mother   . Hyperlipidemia Mother   . Osteoarthritis Mother   . Heart disease Maternal Grandmother   . Cancer Maternal Grandfather   . Mental illness Paternal Grandmother   . Heart disease Paternal Grandfather     ROS:  Pertinent items are noted in HPI.  Otherwise, a comprehensive ROS was negative.  Exam:   BP 110/72   Pulse 68   Temp (!) 97.2 F (36.2 C) (Skin)   Resp 16   Ht 5' 0.25" (1.53 m)   Wt 142 lb (64.4 kg)   BMI 27.50 kg/m  Height: 5' 0.25" (153 cm) Ht Readings from Last 3 Encounters:  05/12/19 5' 0.25" (1.53 m)  05/06/18 5' 0.25" (1.53 m)  07/09/17 4' 11.75" (1.518 m)    General appearance: alert, cooperative and appears stated age Head: Normocephalic, without obvious abnormality, atraumatic Neck: no adenopathy, supple, symmetrical, trachea midline and thyroid normal to inspection and palpation Lungs: clear to auscultation bilaterally Breasts: normal appearance, no masses or tenderness, No nipple retraction or dimpling, No nipple discharge or bleeding, No axillary or supraclavicular adenopathy Heart: regular rate and rhythm Abdomen: soft, non-tender; no masses,  no  organomegaly Extremities: extremities normal, atraumatic, no cyanosis or edema Skin: Skin color, texture, turgor normal. No rashes or lesions Lymph nodes: Cervical, supraclavicular, and axillary nodes normal. No abnormal inguinal nodes palpated Neurologic: Grossly normal   Pelvic: External genitalia:  no lesions              Urethra:  normal appearing urethra with no masses, tenderness or lesions              Bartholin's and Skene's: normal                 Vagina: normal appearing vagina with normal color and discharge, no lesions               Cervix: no cervical motion tenderness, no lesions and normal appearance              Pap taken: Yes.   Bimanual Exam:  Uterus:  normal size, contour, position, consistency, mobility, non-tender and anteverted              Adnexa: normal adnexa and no mass, fullness, tenderness               Rectovaginal: Confirms               Anus:  normal sphincter tone, no lesions  Chaperone present: yes  A:  Well Woman with normal exam  Contraception POP due to history of migraine with aura  History of ASCUS + HPV, LSIL 2020 and history of LEEP for abnormal pap smear, follow up pap today.  Mammogram due  Gardasil candidate   P:   Reviewed health and wellness pertinent to exam  Risks/benefits/warning signs with POP reviewed, desires continuance.  Rx Micronor see order with instructions  Discussed with patient this will help with abnormal pap HPV. She is going to check with insurance and will advise if she desires.  Pap smear: yes If normal will repeat one year, if not per results   counseled on breast self exam, mammography screening, STD prevention, HIV risk factors and prevention, feminine hygiene, use and side effects of OCP's, adequate intake of calcium and vitamin D, diet and exercise  return annually or prn  An After Visit Summary was printed and given to the patient.

## 2019-05-11 ENCOUNTER — Other Ambulatory Visit: Payer: Self-pay

## 2019-05-12 ENCOUNTER — Ambulatory Visit (INDEPENDENT_AMBULATORY_CARE_PROVIDER_SITE_OTHER): Payer: 59 | Admitting: Certified Nurse Midwife

## 2019-05-12 ENCOUNTER — Other Ambulatory Visit (HOSPITAL_COMMUNITY)
Admission: RE | Admit: 2019-05-12 | Discharge: 2019-05-12 | Disposition: A | Discharge status: Home / Self Care | Payer: 59 | Source: Ambulatory Visit | Attending: Certified Nurse Midwife | Admitting: Certified Nurse Midwife

## 2019-05-12 ENCOUNTER — Encounter: Payer: Self-pay | Admitting: Certified Nurse Midwife

## 2019-05-12 ENCOUNTER — Other Ambulatory Visit: Payer: Self-pay

## 2019-05-12 VITALS — BP 110/72 | HR 68 | Temp 97.2°F | Resp 16 | Ht 60.25 in | Wt 142.0 lb

## 2019-05-12 DIAGNOSIS — Z01419 Encounter for gynecological examination (general) (routine) without abnormal findings: Secondary | ICD-10-CM | POA: Diagnosis not present

## 2019-05-12 DIAGNOSIS — Z124 Encounter for screening for malignant neoplasm of cervix: Secondary | ICD-10-CM

## 2019-05-12 DIAGNOSIS — Z8669 Personal history of other diseases of the nervous system and sense organs: Secondary | ICD-10-CM

## 2019-05-12 DIAGNOSIS — Z3041 Encounter for surveillance of contraceptive pills: Secondary | ICD-10-CM

## 2019-05-12 DIAGNOSIS — Z9889 Other specified postprocedural states: Secondary | ICD-10-CM

## 2019-05-12 DIAGNOSIS — Z8742 Personal history of other diseases of the female genital tract: Secondary | ICD-10-CM | POA: Diagnosis not present

## 2019-05-12 MED ORDER — NORETHINDRONE 0.35 MG PO TABS: 1.0000 | ORAL_TABLET | Freq: Every day | ORAL | 4 refills | Status: DC

## 2019-05-12 NOTE — Patient Instructions (Signed)
EXERCISE AND DIET:  We recommended that you start or continue a regular exercise program for good health. Regular exercise means any activity that makes your heart beat faster and makes you sweat.  We recommend exercising at least 30 minutes per day at least 3 days a week, preferably 4 or 5.  We also recommend a diet low in fat and sugar.  Inactivity, poor dietary choices and obesity can cause diabetes, heart attack, stroke, and kidney damage, among others.    ALCOHOL AND SMOKING:  Women should limit their alcohol intake to no more than 7 drinks/beers/glasses of wine (combined, not each!) per week. Moderation of alcohol intake to this level decreases your risk of breast cancer and liver damage. And of course, no recreational drugs are part of a healthy lifestyle.  And absolutely no smoking or even second hand smoke. Most people know smoking can cause heart and lung diseases, but did you know it also contributes to weakening of your bones? Aging of your skin?  Yellowing of your teeth and nails?  CALCIUM AND VITAMIN D:  Adequate intake of calcium and Vitamin D are recommended.  The recommendations for exact amounts of these supplements seem to change often, but generally speaking 600 mg of calcium (either carbonate or citrate) and 800 units of Vitamin D per day seems prudent. Certain women may benefit from higher intake of Vitamin D.  If you are among these women, your doctor will have told you during your visit.    PAP SMEARS:  Pap smears, to check for cervical cancer or precancers,  have traditionally been done yearly, although recent scientific advances have shown that most women can have pap smears less often.  However, every woman still should have a physical exam from her gynecologist every year. It will include a breast check, inspection of the vulva and vagina to check for abnormal growths or skin changes, a visual exam of the cervix, and then an exam to evaluate the size and shape of the uterus and  ovaries.  And after 42 years of age, a rectal exam is indicated to check for rectal cancers. We will also provide age appropriate advice regarding health maintenance, like when you should have certain vaccines, screening for sexually transmitted diseases, bone density testing, colonoscopy, mammograms, etc.   MAMMOGRAMS:  All women over 52 years old should have a yearly mammogram. Many facilities now offer a "3D" mammogram, which may cost around $50 extra out of pocket. If possible,  we recommend you accept the option to have the 3D mammogram performed.  It both reduces the number of women who will be called back for extra views which then turn out to be normal, and it is better than the routine mammogram at detecting truly abnormal areas.    COLONOSCOPY:  Colonoscopy to screen for colon cancer is recommended for all women at age 18.  We know, you hate the idea of the prep.  We agree, BUT, having colon cancer and not knowing it is worse!!  Colon cancer so often starts as a polyp that can be seen and removed at colonscopy, which can quite literally save your life!  And if your first colonoscopy is normal and you have no family history of colon cancer, most women don't have to have it again for 10 years.  Once every ten years, you can do something that may end up saving your life, right?  We will be happy to help you get it scheduled when you are ready.  Be sure to check your insurance coverage so you understand how much it will cost.  It may be covered as a preventative service at no cost, but you should check your particular policy.     Human Papillomavirus Quadrivalent Vaccine suspension for injection What is this medicine? HUMAN PAPILLOMAVIRUS VACCINE (HYOO muhn pap uh LOH muh vahy ruhs vak SEEN) is a vaccine. It is used to prevent infections of four types of the human papillomavirus. In women, the vaccine may lower your risk of getting cervical, vaginal, vulvar, or anal cancer and genital warts. In men,  the vaccine may lower your risk of getting genital warts and anal cancer. You cannot get these diseases from the vaccine. This vaccine does not treat these diseases. This medicine may be used for other purposes; ask your health care provider or pharmacist if you have questions. COMMON BRAND NAME(S): Gardasil What should I tell my health care provider before I take this medicine? They need to know if you have any of these conditions:  fever or infection  hemophilia  HIV infection or AIDS  immune system problems  low platelet count  an unusual reaction to Human Papillomavirus Vaccine, yeast, other medicines, foods, dyes, or preservatives  pregnant or trying to get pregnant  breast-feeding How should I use this medicine? This vaccine is for injection in a muscle on your upper arm or thigh. It is given by a health care professional. You will be observed for 15 minutes after each dose. Sometimes, fainting happens after the vaccine is given. You may be asked to sit or lie down during the 15 minutes. Three doses are given. The second dose is given 2 months after the first dose. The last dose is given 4 months after the second dose. A copy of a Vaccine Information Statement will be given before each vaccination. Read this sheet carefully each time. The sheet may change frequently. Talk to your pediatrician regarding the use of this medicine in children. While this drug may be prescribed for children as young as 9 years of age for selected conditions, precautions do apply. Overdosage: If you think you have taken too much of this medicine contact a poison control center or emergency room at once. NOTE: This medicine is only for you. Do not share this medicine with others. What if I miss a dose? All 3 doses of the vaccine should be given within 6 months. Remember to keep appointments for follow-up doses. Your health care provider will tell you when to return for the next vaccine. Ask your health  care professional for advice if you are unable to keep an appointment or miss a scheduled dose. What may interact with this medicine?  other vaccines This list may not describe all possible interactions. Give your health care provider a list of all the medicines, herbs, non-prescription drugs, or dietary supplements you use. Also tell them if you smoke, drink alcohol, or use illegal drugs. Some items may interact with your medicine. What should I watch for while using this medicine? This vaccine may not fully protect everyone. Continue to have regular pelvic exams and cervical or anal cancer screenings as directed by your doctor. The Human Papillomavirus is a sexually transmitted disease. It can be passed by any kind of sexual activity that involves genital contact. The vaccine works best when given before you have any contact with the virus. Many people who have the virus do not have any signs or symptoms. Tell your doctor or health care professional   if you have any reaction or unusual symptom after getting the vaccine. What side effects may I notice from receiving this medicine? Side effects that you should report to your doctor or health care professional as soon as possible:  allergic reactions like skin rash, itching or hives, swelling of the face, lips, or tongue  breathing problems  feeling faint or lightheaded, falls Side effects that usually do not require medical attention (report to your doctor or health care professional if they continue or are bothersome):  cough  dizziness  fever  headache  nausea  redness, warmth, swelling, pain, or itching at site where injected This list may not describe all possible side effects. Call your doctor for medical advice about side effects. You may report side effects to FDA at 1-800-FDA-1088. Where should I keep my medicine? This drug is given in a hospital or clinic and will not be stored at home. NOTE: This sheet is a summary. It may  not cover all possible information. If you have questions about this medicine, talk to your doctor, pharmacist, or health care provider.  2020 Elsevier/Gold Standard (2013-06-05 13:14:33)  

## 2019-05-14 ENCOUNTER — Other Ambulatory Visit: Payer: Self-pay | Admitting: Certified Nurse Midwife

## 2019-05-14 DIAGNOSIS — Z3041 Encounter for surveillance of contraceptive pills: Secondary | ICD-10-CM

## 2019-05-16 LAB — CYTOLOGY - PAP
Comment: NEGATIVE
High risk HPV: POSITIVE — AB

## 2019-05-18 ENCOUNTER — Telehealth: Payer: Self-pay | Admitting: *Deleted

## 2019-05-18 DIAGNOSIS — R87612 Low grade squamous intraepithelial lesion on cytologic smear of cervix (LGSIL): Secondary | ICD-10-CM

## 2019-05-18 DIAGNOSIS — B977 Papillomavirus as the cause of diseases classified elsewhere: Secondary | ICD-10-CM

## 2019-05-18 NOTE — Telephone Encounter (Signed)
-----   Message from Verner Chol, CNM sent at 05/16/2019  4:44 PM EST ----- Notify patient her pap smear shows LSIL again with Positive HPV. She will need to schedule colposcopy evaluation.

## 2019-05-18 NOTE — Telephone Encounter (Signed)
Leda Min, RN  05/18/2019 9:54 AM EST    Left message to call Noreene Larsson, RN at [facility] [phone].

## 2019-05-24 NOTE — Telephone Encounter (Signed)
Left message to call Karalina Tift, RN at [facility] [phone].   

## 2019-05-24 NOTE — Telephone Encounter (Signed)
Visit Follow-Up Question Received: Yesterday Message Contents  Marisa Hawkins, Klosinski sent to [facility]  Phone Number: [phone]  Hi! I reviewed results of my pap. Would [name] like for me to come for a colposcopy again or just another pap?

## 2019-05-29 NOTE — Telephone Encounter (Signed)
Patient has not returned call, see patients MyChart message.   MyChart message to patient with recommendations.   Order placed for Colpo for precert.

## 2019-06-06 NOTE — Telephone Encounter (Signed)
Left message to call Noreene Larsson, RN at [facility] [phone], requesting return call to further discuss recent results.   May speak with any available triage nurse.

## 2019-06-13 NOTE — Telephone Encounter (Addendum)
Letter printed and to Dr. Oscar La to review.

## 2019-06-13 NOTE — Telephone Encounter (Signed)
Patient has not returned call to discuss results and recommendations.   Please advise.

## 2019-06-13 NOTE — Telephone Encounter (Signed)
I think we should send letter at this point.

## 2019-06-14 NOTE — Telephone Encounter (Signed)
Letter reviewed and signed by Dr. Oscar La.   Letter mailed via standard [place] mail.   1 mo recall placed.   Routing to Leota Sauers, CNM FYI  Cc: Dr. Oscar La

## 2019-07-14 ENCOUNTER — Encounter: Payer: Self-pay | Admitting: Certified Nurse Midwife

## 2019-08-01 ENCOUNTER — Telehealth: Payer: Self-pay | Admitting: *Deleted

## 2019-08-01 NOTE — Telephone Encounter (Signed)
Patient had aex and pap smear on 05-12-19. Unable to reach patient in regards to results and need for colposcopy. Letter mailed to patient on 06-14-19 to advise of need for colposcopy. 1 month recall placed for 03/21. Patient has not returned call to schedule. Please advise on recall status.   Routing to provider for review.

## 2019-08-03 NOTE — Telephone Encounter (Signed)
Patient removed from 08 recall per Dr. Jertson.  ° °Encounter closed.  °

## 2019-08-03 NOTE — Telephone Encounter (Signed)
The patient has been notified, take out of recall

## 2019-09-06 ENCOUNTER — Telehealth: Payer: Self-pay | Admitting: Obstetrics and Gynecology

## 2019-09-06 ENCOUNTER — Telehealth: Payer: Self-pay

## 2019-09-06 DIAGNOSIS — R87612 Low grade squamous intraepithelial lesion on cytologic smear of cervix (LGSIL): Secondary | ICD-10-CM

## 2019-09-06 DIAGNOSIS — B977 Papillomavirus as the cause of diseases classified elsewhere: Secondary | ICD-10-CM

## 2019-09-06 NOTE — Telephone Encounter (Signed)
Spoke with patient, calling to schedule colpo. AEX with Marisa Hawkins, CNM  on 05/12/19, pap LSIL, positive hpv. Patient did not return call to schedule. Requesting to schedule on a Friday with Dr. Oscar La. Does not have menses with POP.   Colpo scheduled for 5/28 at 8:30am with Dr. Oscar La.  New order placed. Advised to take Motrin 800 mg with food and water one hour before procedure. Patient verbalzies understanding and is agreeable.   Routing to provider for final review. Patient is agreeable to disposition. Will close encounter.  Cc: Soundra Pilon

## 2019-09-06 NOTE — Telephone Encounter (Signed)
Call placed to convey benefits for 09/22/19 appointment.

## 2019-09-06 NOTE — Telephone Encounter (Signed)
Patient called wanting to schedule a colposcopy.

## 2019-09-12 NOTE — Telephone Encounter (Signed)
Second call placed to convey benefits for colposcopy. °

## 2019-09-15 NOTE — Telephone Encounter (Signed)
Third call placed to convey benefits for 09/22/19 appointment.

## 2019-09-19 NOTE — Telephone Encounter (Signed)
Fourth call placed to convey benefits for 09/22/19 appointment.

## 2019-09-20 NOTE — Telephone Encounter (Signed)
Fifth call placed to convey benefits for 09/22/19 appointment.

## 2019-09-22 ENCOUNTER — Encounter: Payer: Self-pay | Admitting: Obstetrics and Gynecology

## 2019-09-22 ENCOUNTER — Ambulatory Visit (INDEPENDENT_AMBULATORY_CARE_PROVIDER_SITE_OTHER): Payer: 59 | Admitting: Obstetrics and Gynecology

## 2019-09-22 ENCOUNTER — Other Ambulatory Visit: Payer: Self-pay

## 2019-09-22 ENCOUNTER — Other Ambulatory Visit (HOSPITAL_COMMUNITY)
Admission: RE | Admit: 2019-09-22 | Discharge: 2019-09-22 | Disposition: A | Discharge status: Home / Self Care | Payer: 59 | Source: Ambulatory Visit | Attending: Obstetrics and Gynecology | Admitting: Obstetrics and Gynecology

## 2019-09-22 VITALS — BP 112/62 | HR 78 | Temp 97.5°F | Ht 60.0 in | Wt 140.0 lb

## 2019-09-22 DIAGNOSIS — R87612 Low grade squamous intraepithelial lesion on cytologic smear of cervix (LGSIL): Secondary | ICD-10-CM | POA: Insufficient documentation

## 2019-09-22 DIAGNOSIS — B977 Papillomavirus as the cause of diseases classified elsewhere: Secondary | ICD-10-CM | POA: Insufficient documentation

## 2019-09-22 DIAGNOSIS — N882 Stricture and stenosis of cervix uteri: Secondary | ICD-10-CM | POA: Diagnosis not present

## 2019-09-22 NOTE — Patient Instructions (Signed)

## 2019-09-22 NOTE — Addendum Note (Signed)
Addended by: Tobi Bastos on: 09/22/2019 04:03 PM   Modules accepted: Orders

## 2019-09-22 NOTE — Progress Notes (Signed)
GYNECOLOGY  VISIT   HPI: 44 y.o.   Single White or Caucasian Not Hispanic or Latino  female   G0P0000 with No LMP recorded. (Menstrual status: Oral contraceptives).   here for  Colposcopy. She has a prior h/o a LEEP in 2010. Pap in 1/20 with LSIL, +HPV, pap in 8/20 ASCUS, +HPV, pap from 1/21 LSIL, +HPV. At her last colposcopy in 1/20 there were no endocervical cells on her ECC.   GYNECOLOGIC HISTORY: No LMP recorded. (Menstrual status: Oral contraceptives). Contraception: OCP Menopausal hormone therapy: none         OB History    Gravida  0   Para  0   Term  0   Preterm  0   AB  0   Living  0     SAB  0   TAB  0   Ectopic  0   Multiple  0   Live Births  0              Patient Active Problem List   Diagnosis Date Noted  . History of loop electrical excision procedure (LEEP) 03/17/2013  . Well woman exam with routine gynecological exam 03/17/2013  . Breast lump on left side at 4 o'clock position 08/19/2012  . Lump or mass in breast 08/19/2012    Past Medical History:  Diagnosis Date  . Abnormal Pap smear    Hx colposcopy and LEEP procedure 2010--Pos HR HPV, 05-06-2018 LGSIL HPV HR+  . Migraines    Migraines as a teenager with aura  . Seasonal allergies   . 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 BREAST SURGERY     breast reduction  . CERVICAL BIOPSY  W/ LOOP ELECTRODE EXCISION  2010  . colposcopy and LEEP      Current Outpatient Medications  Medication Sig Dispense Refill  . norethindrone (MICRONOR) 0.35 MG tablet Take 1 tablet (0.35 mg total) by mouth daily. 3 Package 4   No current facility-administered medications for this visit.     ALLERGIES: Patient has no known allergies.  Family History  Problem Relation Age of Onset  . Arthritis Mother   . Diabetes Mother   . Hyperlipidemia Mother   . Osteoarthritis Mother   . Heart disease Maternal Grandmother   . Cancer Maternal  Grandfather   . Mental illness Paternal Grandmother   . Heart disease Paternal Grandfather     Social History   Socioeconomic History  . Marital status: Single    Spouse name: Not on file  . Number of children: Not on file  . Years of education: Not on file  . Highest education level: Not on file  Occupational History  . Not on file  Tobacco Use  . Smoking status: Never Smoker  . Smokeless tobacco: Never Used  Substance and Sexual Activity  . Alcohol use: Yes    Alcohol/week: 3.0 standard drinks    Types: 3 Standard drinks or equivalent per week  . Drug use: No  . Sexual activity: Yes    Partners: Male    Birth control/protection: Pill  Other Topics Concern  . Not on file  Social History Narrative  . Not on file   Social Determinants of Health   Financial Resource Strain:   . Difficulty of Paying Living Expenses:   Food Insecurity:   . Worried About Programme researcher, broadcasting/film/video in the Last Year:   .  Ran Out of Food in the Last Year:   Transportation Needs:   . Film/video editor (Medical):   Marland Kitchen Lack of Transportation (Non-Medical):   Physical Activity:   . Days of Exercise per Week:   . Minutes of Exercise per Session:   Stress:   . Feeling of Stress :   Social Connections:   . Frequency of Communication with Friends and Family:   . Frequency of Social Gatherings with Friends and Family:   . Attends Religious Services:   . Active Member of Clubs or Organizations:   . Attends Archivist Meetings:   Marland Kitchen Marital Status:   Intimate Partner Violence:   . Fear of Current or Ex-Partner:   . Emotionally Abused:   Marland Kitchen Physically Abused:   . Sexually Abused:     Review of Systems  All other systems reviewed and are negative.   PHYSICAL EXAMINATION:    BP 112/62   Pulse 78   Temp (!) 97.5 F (36.4 C)   Ht 5' (1.524 m)   Wt 140 lb (63.5 kg)   SpO2 99%   BMI 27.34 kg/m     General appearance: alert, cooperative and appears stated age  Pelvic: External  genitalia:  no lesions              Urethra:  normal appearing urethra with no masses, tenderness or lesions              Bartholins and Skenes: normal                 Vagina: normal appearing vagina with normal color and discharge, no lesions              Cervix: no lesions and stenotic               Colposcopy: not satisfactory, no aceto-white changes, cervix stenotic. Negative lugols examination of the cervix and upper vagina.  Tenaculum placed on the cervix and the cervix was dilated with the mini-dilators up to the #4 hagar dilator. The ECC was performed. The tenaculum was removed.   Chaperone was present for exam.  ASSESSMENT LSIL pap with +HPV Unsatisfactory colposcopy Cervical stenosis, required dilation to obtain [facility]    PLAN Colposcopy with ECC done Further plans depending on results

## 2019-09-26 LAB — SURGICAL PATHOLOGY

## 2019-09-27 ENCOUNTER — Telehealth: Payer: Self-pay

## 2019-09-27 NOTE — Telephone Encounter (Signed)
Left message for pt to return call to triage RN. 

## 2019-09-27 NOTE — Telephone Encounter (Signed)
-----   Message from Romualdo Bolk, MD sent at 09/27/2019  1:17 PM EDT ----- Please inform the patient that her ECC returned with CIN I. I would recommend a f/u pap at her annual exam next year.

## 2019-09-27 NOTE — Telephone Encounter (Signed)
Spoke with pt. Pt given results and recommendations per Dr Oscar La. Pt agreeable. Pt has next AEX 04/2020 scheduled. Pt verbalized understanding. Recall placed.  Encounter closed.

## 2020-05-15 ENCOUNTER — Ambulatory Visit: Payer: 59 | Admitting: Obstetrics and Gynecology

## 2020-05-15 ENCOUNTER — Other Ambulatory Visit (HOSPITAL_COMMUNITY)
Admission: RE | Admit: 2020-05-15 | Discharge: 2020-05-15 | Disposition: A | Discharge status: Home / Self Care | Payer: 59 | Source: Ambulatory Visit | Attending: Obstetrics and Gynecology | Admitting: Obstetrics and Gynecology

## 2020-05-15 ENCOUNTER — Other Ambulatory Visit: Payer: Self-pay

## 2020-05-15 ENCOUNTER — Encounter: Payer: Self-pay | Admitting: Obstetrics and Gynecology

## 2020-05-15 VITALS — BP 120/76 | HR 72 | Ht 60.5 in | Wt 137.0 lb

## 2020-05-15 DIAGNOSIS — Z9889 Other specified postprocedural states: Secondary | ICD-10-CM | POA: Diagnosis not present

## 2020-05-15 DIAGNOSIS — Z0000 Encounter for general adult medical examination without abnormal findings: Secondary | ICD-10-CM

## 2020-05-15 DIAGNOSIS — Z124 Encounter for screening for malignant neoplasm of cervix: Secondary | ICD-10-CM | POA: Insufficient documentation

## 2020-05-15 DIAGNOSIS — Z01419 Encounter for gynecological examination (general) (routine) without abnormal findings: Secondary | ICD-10-CM | POA: Diagnosis not present

## 2020-05-15 DIAGNOSIS — N882 Stricture and stenosis of cervix uteri: Secondary | ICD-10-CM

## 2020-05-15 DIAGNOSIS — Z3041 Encounter for surveillance of contraceptive pills: Secondary | ICD-10-CM

## 2020-05-15 MED ORDER — NORETHINDRONE 0.35 MG PO TABS: 1.0000 | ORAL_TABLET | Freq: Every day | ORAL | 4 refills | Status: DC

## 2020-05-15 NOTE — Patient Instructions (Signed)

## 2020-05-15 NOTE — Progress Notes (Signed)
45 y.o. G0P0000 Single White or Caucasian Not Hispanic or Latino female here for annual exam.  She is on POP, just occasional spotting. Sexually active, same partner x 2 years. No dyspareunia.     No LMP recorded (lmp unknown). (Menstrual status: Oral contraceptives).          Sexually active: Yes.    The current method of family planning is POP (estrogen/progesterone).    Exercising: Yes.    walking Smoker:  no  Health Maintenance: Pap:   09/22/19 Colposcopy unsatisfactory, no abnormalities seen. Cervix stenotic, dilated and ECC done which showed CIN I  05/12/19 LSIL:Pos HR HPV  12/16/18 ASCUS:Pos HR HPV History of abnormal Pap:  Yes, H/O leep in 2010 MMG:  05/01/16 BIRADS 2 benign/density b BMD:   n/a Colonoscopy: n/a TDaP:  2013  Gardasil: none   reports that she has never smoked. She has never used smokeless tobacco. She reports current alcohol use of about 3.0 standard drinks of alcohol per week. She reports that she does not use drugs. She is a Armed forces operational officer.   Past Medical History:  Diagnosis Date  . Abnormal Pap smear    Hx colposcopy and LEEP procedure 2010--Pos HR HPV, 05-06-2018 LGSIL HPV HR+  . Migraines    Migraines as a teenager with aura  . Seasonal allergies   . 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 BREAST SURGERY     breast reduction  . CERVICAL BIOPSY  W/ LOOP ELECTRODE EXCISION  2010  . colposcopy and LEEP      Current Outpatient Medications  Medication Sig Dispense Refill  . norethindrone (MICRONOR) 0.35 MG tablet Take 1 tablet (0.35 mg total) by mouth daily. 3 Package 4   No current facility-administered medications for this visit.    Family History  Problem Relation Age of Onset  . Arthritis Mother   . Diabetes Mother   . Hyperlipidemia Mother   . Osteoarthritis Mother   . Heart disease Maternal Grandmother   . Cancer Maternal Grandfather   . Mental illness Paternal  Grandmother   . Heart disease Paternal Grandfather   MGF with prostate cancer.  Dad had kidney cancer last year.   Review of Systems  Constitutional: Negative.   HENT: Negative.   Eyes: Negative.   Respiratory: Negative.   Cardiovascular: Negative.   Gastrointestinal: Negative.   Endocrine: Negative.   Genitourinary: Negative.   Musculoskeletal: Negative.   Skin: Negative.   Allergic/Immunologic: Negative.   Neurological: Negative.   Hematological: Negative.   Psychiatric/Behavioral: Negative.     Exam:   BP 120/76 (BP Location: Right Arm, Patient Position: Sitting, Cuff Size: Normal)   Pulse 72   Ht 5' 0.5" (1.537 m)   Wt 137 lb (62.1 kg)   LMP  (LMP Unknown)   BMI 26.32 kg/m   Weight change: @WEIGHTCHANGE @ Height:   Height: 5' 0.5" (153.7 cm)  Ht Readings from Last 3 Encounters:  05/15/20 5' 0.5" (1.537 m)  09/22/19 5' (1.524 m)  05/12/19 5' 0.25" (1.53 m)    General appearance: alert, cooperative and appears stated age Head: Normocephalic, without obvious abnormality, atraumatic Neck: no adenopathy, supple, symmetrical, trachea midline and thyroid normal to inspection and palpation Lungs: clear to auscultation bilaterally Cardiovascular: regular rate and rhythm Breasts: normal appearance, no masses or tenderness Abdomen: soft, non-tender; non distended,  no masses,  no organomegaly Extremities: extremities normal, atraumatic, no cyanosis or  edema Skin: Skin color, texture, turgor normal. No rashes or lesions Lymph nodes: Cervical, supraclavicular, and axillary nodes normal. No abnormal inguinal nodes palpated Neurologic: Grossly normal   Pelvic: External genitalia:  no lesions              Urethra:  normal appearing urethra with no masses, tenderness or lesions              Bartholins and Skenes: normal                 Vagina: normal appearing vagina with normal color and discharge, no lesions              Cervix: no lesions and stenotic                Bimanual Exam:  Uterus:  normal size, contour, position, consistency, mobility, non-tender              Adnexa: no mass, fullness, tenderness               Rectovaginal: Confirms               Anus:  normal sphincter tone, no lesions   1. Well woman exam Discussed breast self exam Discussed calcium and vit D intake Mammogram overdue, # given  2. Screening for cervical cancer  - Cytology - PAP  3. History of loop electrical excision procedure (LEEP)   4. Cervical stenosis (uterine cervix) If she needs a colposcopy, will pre treat with misoprostol  5. Laboratory exam ordered as part of routine general medical examination  - CBC - Comprehensive metabolic panel - Lipid panel  6. Encounter for surveillance of contraceptive pills Doing well - norethindrone (MICRONOR) 0.35 MG tablet; Take 1 tablet (0.35 mg total) by mouth daily.  Dispense: 84 tablet; Refill: 4

## 2020-05-16 LAB — COMPREHENSIVE METABOLIC PANEL
AG Ratio: 1.6 (calc) (ref 1.0–2.5)
ALT: 31 U/L — ABNORMAL HIGH (ref 6–29)
AST: 20 U/L (ref 10–30)
Albumin: 4.4 g/dL (ref 3.6–5.1)
Alkaline phosphatase (APISO): 51 U/L (ref 31–125)
BUN: 12 mg/dL (ref 7–25)
CO2: 29 mmol/L (ref 20–32)
Calcium: 9.6 mg/dL (ref 8.6–10.2)
Chloride: 103 mmol/L (ref 98–110)
Creat: 0.87 mg/dL (ref 0.50–1.10)
Globulin: 2.7 g/dL (calc) (ref 1.9–3.7)
Glucose, Bld: 74 mg/dL (ref 65–99)
Potassium: 4 mmol/L (ref 3.5–5.3)
Sodium: 139 mmol/L (ref 135–146)
Total Bilirubin: 0.4 mg/dL (ref 0.2–1.2)
Total Protein: 7.1 g/dL (ref 6.1–8.1)

## 2020-05-16 LAB — LIPID PANEL
Cholesterol: 170 mg/dL (ref <200)
HDL: 78 mg/dL (ref >=50)
LDL Cholesterol (Calc): 75 mg/dL (calc)
Non-HDL Cholesterol (Calc): 92 mg/dL (calc) (ref <130)
Total CHOL/HDL Ratio: 2.2 (calc) (ref <5.0)
Triglycerides: 90 mg/dL (ref <150)

## 2020-05-16 LAB — CBC
HCT: 37.7 % (ref 35.0–45.0)
Hemoglobin: 13.1 g/dL (ref 11.7–15.5)
MCH: 32.4 pg (ref 27.0–33.0)
MCHC: 34.7 g/dL (ref 32.0–36.0)
MCV: 93.3 fL (ref 80.0–100.0)
MPV: 10.2 fL (ref 7.5–12.5)
Platelets: 298 10*3/uL (ref 140–400)
RBC: 4.04 10*6/uL (ref 3.80–5.10)
RDW: 11.8 % (ref 11.0–15.0)
WBC: 7.2 10*3/uL (ref 3.8–10.8)

## 2020-05-17 ENCOUNTER — Ambulatory Visit: Payer: 59 | Admitting: Certified Nurse Midwife

## 2020-05-17 LAB — CYTOLOGY - PAP
Adequacy: ABSENT
Comment: NEGATIVE
Diagnosis: NEGATIVE
High risk HPV: POSITIVE — AB

## 2020-05-21 ENCOUNTER — Other Ambulatory Visit: Payer: Self-pay

## 2020-05-21 DIAGNOSIS — R7401 Elevation of levels of liver transaminase levels: Secondary | ICD-10-CM

## 2020-06-21 ENCOUNTER — Other Ambulatory Visit: Payer: Self-pay

## 2020-06-21 ENCOUNTER — Other Ambulatory Visit (INDEPENDENT_AMBULATORY_CARE_PROVIDER_SITE_OTHER): Payer: 59

## 2020-06-21 DIAGNOSIS — R7401 Elevation of levels of liver transaminase levels: Secondary | ICD-10-CM

## 2020-06-22 LAB — HEPATIC FUNCTION PANEL
AG Ratio: 1.9 (calc) (ref 1.0–2.5)
ALT: 18 U/L (ref 6–29)
AST: 14 U/L (ref 10–30)
Albumin: 4.7 g/dL (ref 3.6–5.1)
Alkaline phosphatase (APISO): 45 U/L (ref 31–125)
Bilirubin, Direct: 0.1 mg/dL (ref 0.0–0.2)
Globulin: 2.5 g/dL (calc) (ref 1.9–3.7)
Indirect Bilirubin: 0.4 mg/dL (calc) (ref 0.2–1.2)
Total Bilirubin: 0.5 mg/dL (ref 0.2–1.2)
Total Protein: 7.2 g/dL (ref 6.1–8.1)

## 2020-11-16 ENCOUNTER — Emergency Department (HOSPITAL_COMMUNITY): Payer: 59 | Admitting: Anesthesiology

## 2020-11-16 ENCOUNTER — Other Ambulatory Visit: Payer: Self-pay

## 2020-11-16 ENCOUNTER — Observation Stay (HOSPITAL_BASED_OUTPATIENT_CLINIC_OR_DEPARTMENT_OTHER)
Admission: EM | Admit: 2020-11-16 | Discharge: 2020-11-18 | Disposition: A | Discharge status: Home / Self Care | Payer: 59 | Attending: Surgery | Admitting: Surgery

## 2020-11-16 ENCOUNTER — Emergency Department (HOSPITAL_BASED_OUTPATIENT_CLINIC_OR_DEPARTMENT_OTHER): Payer: 59

## 2020-11-16 ENCOUNTER — Encounter (HOSPITAL_COMMUNITY)
Admission: EM | Disposition: A | Discharge status: Home / Self Care | Payer: Self-pay | Source: Home / Self Care | Attending: Emergency Medicine

## 2020-11-16 ENCOUNTER — Encounter (HOSPITAL_BASED_OUTPATIENT_CLINIC_OR_DEPARTMENT_OTHER): Payer: Self-pay | Admitting: Emergency Medicine

## 2020-11-16 DIAGNOSIS — Z20822 Contact with and (suspected) exposure to covid-19: Secondary | ICD-10-CM | POA: Diagnosis not present

## 2020-11-16 DIAGNOSIS — K3532 Acute appendicitis with perforation and localized peritonitis, without abscess: Secondary | ICD-10-CM | POA: Diagnosis not present

## 2020-11-16 DIAGNOSIS — R1031 Right lower quadrant pain: Secondary | ICD-10-CM | POA: Diagnosis present

## 2020-11-16 DIAGNOSIS — K37 Unspecified appendicitis: Secondary | ICD-10-CM | POA: Diagnosis present

## 2020-11-16 DIAGNOSIS — Z79899 Other long term (current) drug therapy: Secondary | ICD-10-CM | POA: Insufficient documentation

## 2020-11-16 HISTORY — PX: LAPAROSCOPIC APPENDECTOMY: SHX408

## 2020-11-16 LAB — URINALYSIS, ROUTINE W REFLEX MICROSCOPIC
Bilirubin Urine: NEGATIVE
Glucose, UA: NEGATIVE mg/dL
Ketones, ur: 15 mg/dL — AB
Leukocytes,Ua: NEGATIVE
Nitrite: NEGATIVE
Specific Gravity, Urine: 1.013 (ref 1.005–1.030)
pH: 6 (ref 5.0–8.0)

## 2020-11-16 LAB — LACTIC ACID, PLASMA
Lactic Acid, Venous: 0.9 mmol/L (ref 0.5–1.9)
Lactic Acid, Venous: 2.4 mmol/L (ref 0.5–1.9)

## 2020-11-16 LAB — CBC
HCT: 39 % (ref 36.0–46.0)
Hemoglobin: 13.3 g/dL (ref 12.0–15.0)
MCH: 31.3 pg (ref 26.0–34.0)
MCHC: 34.1 g/dL (ref 30.0–36.0)
MCV: 91.8 fL (ref 80.0–100.0)
Platelets: 242 10*3/uL (ref 150–400)
RBC: 4.25 MIL/uL (ref 3.87–5.11)
RDW: 12.7 % (ref 11.5–15.5)
WBC: 15.1 10*3/uL — ABNORMAL HIGH (ref 4.0–10.5)
nRBC: 0 % (ref 0.0–0.2)

## 2020-11-16 LAB — COMPREHENSIVE METABOLIC PANEL
ALT: 13 U/L (ref 0–44)
AST: 10 U/L — ABNORMAL LOW (ref 15–41)
Albumin: 4.2 g/dL (ref 3.5–5.0)
Alkaline Phosphatase: 46 U/L (ref 38–126)
Anion gap: 10 (ref 5–15)
BUN: <5 mg/dL — ABNORMAL LOW (ref 6–20)
CO2: 21 mmol/L — ABNORMAL LOW (ref 22–32)
Calcium: 9 mg/dL (ref 8.9–10.3)
Chloride: 102 mmol/L (ref 98–111)
Creatinine, Ser: 0.62 mg/dL (ref 0.44–1.00)
GFR, Estimated: >60 mL/min (ref >60)
Glucose, Bld: 163 mg/dL — ABNORMAL HIGH (ref 70–99)
Potassium: 3.5 mmol/L (ref 3.5–5.1)
Sodium: 133 mmol/L — ABNORMAL LOW (ref 135–145)
Total Bilirubin: 0.8 mg/dL (ref 0.3–1.2)
Total Protein: 6.9 g/dL (ref 6.5–8.1)

## 2020-11-16 LAB — PREGNANCY, URINE: Preg Test, Ur: NEGATIVE

## 2020-11-16 LAB — RESP PANEL BY RT-PCR (FLU A&B, COVID) ARPGX2
Influenza A by PCR: NEGATIVE
Influenza B by PCR: NEGATIVE
SARS Coronavirus 2 by RT PCR: NEGATIVE

## 2020-11-16 LAB — LIPASE, BLOOD: Lipase: <10 U/L — ABNORMAL LOW (ref 11–51)

## 2020-11-16 LAB — HIV ANTIBODY (ROUTINE TESTING W REFLEX): HIV Screen 4th Generation wRfx: NONREACTIVE

## 2020-11-16 SURGERY — APPENDECTOMY, LAPAROSCOPIC
Anesthesia: General | Site: Abdomen

## 2020-11-16 MED ORDER — SCOPOLAMINE 1 MG/3DAYS TD PT72
MEDICATED_PATCH | TRANSDERMAL | Status: AC
  Filled 2020-11-16: qty 1

## 2020-11-16 MED ORDER — LIDOCAINE HCL (CARDIAC) PF 100 MG/5ML IV SOSY
PREFILLED_SYRINGE | INTRAVENOUS | Status: DC | PRN
  Administered 2020-11-16: 60 mg via INTRAVENOUS

## 2020-11-16 MED ORDER — ACETAMINOPHEN 500 MG PO TABS
ORAL_TABLET | ORAL | Status: AC
  Filled 2020-11-16: qty 2

## 2020-11-16 MED ORDER — KCL IN DEXTROSE-NACL 20-5-0.9 MEQ/L-%-% IV SOLN
INTRAVENOUS | Status: DC
  Filled 2020-11-16 (×2): qty 1000

## 2020-11-16 MED ORDER — ONDANSETRON HCL 4 MG/2ML IJ SOLN
INTRAMUSCULAR | Status: AC
  Filled 2020-11-16: qty 2

## 2020-11-16 MED ORDER — PROMETHAZINE HCL 25 MG/ML IJ SOLN: 6.2500 mg | INTRAMUSCULAR | Status: DC | PRN

## 2020-11-16 MED ORDER — SODIUM CHLORIDE 0.9 % IV SOLN
INTRAVENOUS | Status: DC
Start: 2020-11-16 — End: 2020-11-18

## 2020-11-16 MED ORDER — OXYCODONE HCL 5 MG/5ML PO SOLN
5.0000 mg | Freq: Once | ORAL | Status: DC | PRN
Start: 2020-11-16 — End: 2020-11-16

## 2020-11-16 MED ORDER — PANTOPRAZOLE SODIUM 40 MG IV SOLR
40.0000 mg | Freq: Every day | INTRAVENOUS | Status: DC
  Administered 2020-11-16 – 2020-11-17 (×2): 40 mg via INTRAVENOUS
  Filled 2020-11-16 (×2): qty 40

## 2020-11-16 MED ORDER — MIDAZOLAM HCL 2 MG/2ML IJ SOLN
INTRAMUSCULAR | Status: AC
  Filled 2020-11-16: qty 2

## 2020-11-16 MED ORDER — PIPERACILLIN-TAZOBACTAM 3.375 G IVPB: 3.3750 g | Freq: Three times a day (TID) | INTRAVENOUS | Status: DC

## 2020-11-16 MED ORDER — 0.9 % SODIUM CHLORIDE (POUR BTL) OPTIME
TOPICAL | Status: DC | PRN
  Administered 2020-11-16: 1000 mL

## 2020-11-16 MED ORDER — ONDANSETRON HCL 4 MG/2ML IJ SOLN: 4.0000 mg | Freq: Four times a day (QID) | INTRAMUSCULAR | Status: DC | PRN

## 2020-11-16 MED ORDER — MORPHINE SULFATE (PF) 2 MG/ML IV SOLN: 1.0000 mg | INTRAVENOUS | Status: DC | PRN

## 2020-11-16 MED ORDER — ROCURONIUM BROMIDE 10 MG/ML (PF) SYRINGE
PREFILLED_SYRINGE | INTRAVENOUS | Status: DC | PRN
  Administered 2020-11-16: 10 mg via INTRAVENOUS
  Administered 2020-11-16: 40 mg via INTRAVENOUS

## 2020-11-16 MED ORDER — ONDANSETRON 4 MG PO TBDP: 4.0000 mg | ORAL_TABLET | Freq: Four times a day (QID) | ORAL | Status: DC | PRN

## 2020-11-16 MED ORDER — SODIUM CHLORIDE 0.9 % IV SOLN
INTRAVENOUS | Status: DC | PRN
  Administered 2020-11-16: 250 mL via INTRAVENOUS

## 2020-11-16 MED ORDER — SUGAMMADEX SODIUM 200 MG/2ML IV SOLN
INTRAVENOUS | Status: DC | PRN
  Administered 2020-11-16: 150 mg via INTRAVENOUS

## 2020-11-16 MED ORDER — ONDANSETRON HCL 4 MG/2ML IJ SOLN
INTRAMUSCULAR | Status: DC | PRN
  Administered 2020-11-16: 4 mg via INTRAVENOUS

## 2020-11-16 MED ORDER — SCOPOLAMINE 1 MG/3DAYS TD PT72
1.0000 | MEDICATED_PATCH | Freq: Once | TRANSDERMAL | Status: AC
  Administered 2020-11-16: 1 via TRANSDERMAL
  Filled 2020-11-16: qty 1

## 2020-11-16 MED ORDER — HEPARIN SODIUM (PORCINE) 5000 UNIT/ML IJ SOLN: 5000.0000 [IU] | Freq: Three times a day (TID) | INTRAMUSCULAR | Status: DC

## 2020-11-16 MED ORDER — IOHEXOL 350 MG/ML SOLN
100.0000 mL | Freq: Once | INTRAVENOUS | Status: AC | PRN
  Administered 2020-11-16: 80 mL via INTRAVENOUS

## 2020-11-16 MED ORDER — LACTATED RINGERS IR SOLN
Status: DC | PRN
  Administered 2020-11-16: 1000 mL

## 2020-11-16 MED ORDER — PANTOPRAZOLE SODIUM 40 MG IV SOLR: 40.0000 mg | Freq: Every day | INTRAVENOUS | Status: DC

## 2020-11-16 MED ORDER — PROPOFOL 10 MG/ML IV BOLUS
INTRAVENOUS | Status: DC | PRN
Start: 2020-11-16 — End: 2020-11-16
  Administered 2020-11-16: 120 mg via INTRAVENOUS

## 2020-11-16 MED ORDER — FENTANYL CITRATE (PF) 250 MCG/5ML IJ SOLN
INTRAMUSCULAR | Status: AC
  Filled 2020-11-16: qty 5

## 2020-11-16 MED ORDER — ACETAMINOPHEN 500 MG PO TABS
1000.0000 mg | ORAL_TABLET | Freq: Once | ORAL | Status: AC
  Administered 2020-11-16: 1000 mg via ORAL

## 2020-11-16 MED ORDER — BUPIVACAINE-EPINEPHRINE 0.25% -1:200000 IJ SOLN
INTRAMUSCULAR | Status: DC | PRN
  Administered 2020-11-16: 20 mL

## 2020-11-16 MED ORDER — OXYCODONE HCL 5 MG PO TABS: 5.0000 mg | ORAL_TABLET | Freq: Once | ORAL | Status: DC | PRN

## 2020-11-16 MED ORDER — FENTANYL CITRATE (PF) 250 MCG/5ML IJ SOLN
INTRAMUSCULAR | Status: DC | PRN
  Administered 2020-11-16: 100 ug via INTRAVENOUS
  Administered 2020-11-16 (×2): 50 ug via INTRAVENOUS

## 2020-11-16 MED ORDER — PIPERACILLIN-TAZOBACTAM 3.375 G IVPB 30 MIN
3.3750 g | Freq: Once | INTRAVENOUS | Status: AC
  Administered 2020-11-16: 3.375 g via INTRAVENOUS
  Filled 2020-11-16: qty 50

## 2020-11-16 MED ORDER — BUPIVACAINE-EPINEPHRINE (PF) 0.25% -1:200000 IJ SOLN
INTRAMUSCULAR | Status: AC
  Filled 2020-11-16: qty 30

## 2020-11-16 MED ORDER — LIDOCAINE 2% (20 MG/ML) 5 ML SYRINGE
INTRAMUSCULAR | Status: AC
  Filled 2020-11-16: qty 5

## 2020-11-16 MED ORDER — KETOROLAC TROMETHAMINE 30 MG/ML IJ SOLN
INTRAMUSCULAR | Status: AC
  Filled 2020-11-16: qty 1

## 2020-11-16 MED ORDER — MORPHINE SULFATE (PF) 4 MG/ML IV SOLN: 4.0000 mg | INTRAVENOUS | Status: DC | PRN

## 2020-11-16 MED ORDER — FENTANYL CITRATE (PF) 100 MCG/2ML IJ SOLN: 25.0000 ug | INTRAMUSCULAR | Status: DC | PRN

## 2020-11-16 MED ORDER — KETOROLAC TROMETHAMINE 30 MG/ML IJ SOLN
INTRAMUSCULAR | Status: DC | PRN
  Administered 2020-11-16: 30 mg via INTRAVENOUS

## 2020-11-16 MED ORDER — PHENYLEPHRINE 40 MCG/ML (10ML) SYRINGE FOR IV PUSH (FOR BLOOD PRESSURE SUPPORT)
PREFILLED_SYRINGE | INTRAVENOUS | Status: DC | PRN
  Administered 2020-11-16 (×3): 80 ug via INTRAVENOUS

## 2020-11-16 MED ORDER — SODIUM CHLORIDE 0.9 % IV BOLUS
1000.0000 mL | Freq: Once | INTRAVENOUS | Status: AC
  Administered 2020-11-16: 1000 mL via INTRAVENOUS

## 2020-11-16 MED ORDER — ROCURONIUM BROMIDE 10 MG/ML (PF) SYRINGE
PREFILLED_SYRINGE | INTRAVENOUS | Status: AC
  Filled 2020-11-16: qty 10

## 2020-11-16 MED ORDER — DEXAMETHASONE SODIUM PHOSPHATE 10 MG/ML IJ SOLN
INTRAMUSCULAR | Status: AC
  Filled 2020-11-16: qty 1

## 2020-11-16 MED ORDER — DEXAMETHASONE SODIUM PHOSPHATE 10 MG/ML IJ SOLN
INTRAMUSCULAR | Status: DC | PRN
  Administered 2020-11-16: 5 mg via INTRAVENOUS

## 2020-11-16 MED ORDER — PIPERACILLIN-TAZOBACTAM 3.375 G IVPB
3.3750 g | Freq: Three times a day (TID) | INTRAVENOUS | Status: DC
  Administered 2020-11-16 – 2020-11-18 (×6): 3.375 g via INTRAVENOUS
  Filled 2020-11-16 (×6): qty 50

## 2020-11-16 MED ORDER — HEPARIN SODIUM (PORCINE) 5000 UNIT/ML IJ SOLN
5000.0000 [IU] | Freq: Three times a day (TID) | INTRAMUSCULAR | Status: DC
  Administered 2020-11-17 – 2020-11-18 (×5): 5000 [IU] via SUBCUTANEOUS
  Filled 2020-11-16 (×5): qty 1

## 2020-11-16 MED ORDER — MIDAZOLAM HCL 2 MG/2ML IJ SOLN
INTRAMUSCULAR | Status: DC | PRN
  Administered 2020-11-16: 2 mg via INTRAVENOUS

## 2020-11-16 MED ORDER — LACTATED RINGERS IV SOLN: INTRAVENOUS | Status: DC | PRN

## 2020-11-16 SURGICAL SUPPLY — 31 items
APPLIER CLIP ROT 10 11.4 M/L (STAPLE)
BAG COUNTER SPONGE SURGICOUNT (BAG) IMPLANT
CABLE HIGH FREQUENCY MONO STRZ (ELECTRODE) ×2 IMPLANT
CHLORAPREP W/TINT 26 (MISCELLANEOUS) ×2 IMPLANT
CLIP APPLIE ROT 10 11.4 M/L (STAPLE) IMPLANT
CUTTER FLEX LINEAR 45M (STAPLE) ×2 IMPLANT
DECANTER SPIKE VIAL GLASS SM (MISCELLANEOUS) ×2 IMPLANT
DERMABOND ADVANCED (GAUZE/BANDAGES/DRESSINGS) ×1
DERMABOND ADVANCED .7 DNX12 (GAUZE/BANDAGES/DRESSINGS) ×1 IMPLANT
ELECT REM PT RETURN 15FT ADLT (MISCELLANEOUS) ×2 IMPLANT
ENDOLOOP SUT PDS II  0 18 (SUTURE)
ENDOLOOP SUT PDS II 0 18 (SUTURE) IMPLANT
GLOVE SURG ENC MOIS LTX SZ7.5 (GLOVE) ×2 IMPLANT
GOWN STRL REUS W/TWL XL LVL3 (GOWN DISPOSABLE) ×6 IMPLANT
KIT BASIN OR (CUSTOM PROCEDURE TRAY) ×2 IMPLANT
KIT TURNOVER KIT A (KITS) ×2 IMPLANT
PENCIL SMOKE EVACUATOR (MISCELLANEOUS) IMPLANT
POUCH SPECIMEN RETRIEVAL 10MM (ENDOMECHANICALS) ×2 IMPLANT
RELOAD 45 THICK GREEN (ENDOMECHANICALS) ×4 IMPLANT
RELOAD 45 VASCULAR/THIN (ENDOMECHANICALS) IMPLANT
RELOAD STAPLE TA45 3.5 REG BLU (ENDOMECHANICALS) IMPLANT
SCISSORS LAP 5X35 DISP (ENDOMECHANICALS) ×2 IMPLANT
SET IRRIG TUBING LAPAROSCOPIC (IRRIGATION / IRRIGATOR) ×2 IMPLANT
SET TUBE SMOKE EVAC HIGH FLOW (TUBING) ×2 IMPLANT
SHEARS HARMONIC ACE PLUS 36CM (ENDOMECHANICALS) ×2 IMPLANT
SUT MNCRL AB 4-0 PS2 18 (SUTURE) ×2 IMPLANT
TOWEL OR 17X26 10 PK STRL BLUE (TOWEL DISPOSABLE) ×2 IMPLANT
TRAY FOLEY MTR SLVR 14FR STAT (SET/KITS/TRAYS/PACK) ×2 IMPLANT
TRAY LAPAROSCOPIC (CUSTOM PROCEDURE TRAY) ×2 IMPLANT
TROCAR BLADELESS OPT 5 100 (ENDOMECHANICALS) ×2 IMPLANT
TROCAR XCEL BLUNT TIP 100MML (ENDOMECHANICALS) ×2 IMPLANT

## 2020-11-16 NOTE — Op Note (Signed)
11/16/2020  6:02 PM  PATIENT:  Marisa Hawkins  45 y.o. female  PRE-OPERATIVE DIAGNOSIS:  appendicitis  POST-OPERATIVE DIAGNOSIS:  appendicitis  PROCEDURE:  Procedure(s): APPENDECTOMY LAPAROSCOPIC (N/A)  SURGEON:  Surgeon(s) and Role:    * Griselda Miner, MD - Primary  PHYSICIAN ASSISTANT:   ASSISTANTS: none   ANESTHESIA:   local and general  EBL:  20 mL   BLOOD ADMINISTERED:none  DRAINS: none   LOCAL MEDICATIONS USED:  MARCAINE     SPECIMEN:  Source of Specimen:  appendix  DISPOSITION OF SPECIMEN:  PATHOLOGY  COUNTS:  YES  TOURNIQUET:  * No tourniquets in log *  DICTATION: .Dragon Dictation  After informed consent was obtained patient was brought to the operating room placed in the supine position on the operating room table. After adequate induction of general anesthesia the patient's abdomen was prepped with ChloraPrep, allowed to dry, and draped in usual sterile manner. The area below the umbilicus was infiltrated with quarter percent Marcaine. A small incision was made with a 15 blade knife. This incision was carried down through the subcutaneous tissue bluntly with a hemostat and [employer] retractors until the linea alba was identified. The linea alba was incised with a 15 blade knife. Each side was grasped Coker clamps and elevated anteriorly. The preperitoneal space was probed bluntly with a hemostat until the peritoneum was opened and access was gained to the abdominal cavity. A 0 Vicryl purse string stitch was placed in the fascia surrounding the opening. A Hassan cannula was placed through the opening and anchored in place with the previously placed Vicryl purse string stitch. The laparoscope was placed through the [facility] cannula. The abdomen was insufflated with carbon dioxide without difficulty. Next the suprapubic area was infiltrated with quarter percent Marcaine. A small incision was made with a 15 blade knife. A 5 mm port was placed bluntly through this incision  into the abdominal cavity. A site was then chosen between the 2 port for placement of a 5 mm port. The area was infiltrated with quarter percent Marcaine. A small stab incision was made with a 15 blade knife. A 5 mm port was placed bluntly through this incision and the abdominal cavity under direct vision. The laparoscope was then moved to the suprapubic port. Using a Glassman grasper and harmonic scalpel the right lower quadrant was inspected. The appendix was readily identified. It was adherent in the pelvis but with blount dissection was able to be elevated. The appendix was elevated anteriorly and the mesoappendix was taken down sharply with the harmonic scalpel. The appendix was somewhat necrotic along the body. The base appeared healthy but still had some inflammtory changes. Once the base of the appendix where it joined the cecum was identified and cleared of any tissue then a laparoscopic GIA green load stapler was placed through the [facility] cannula. The stapler was placed across the base of the appendix clamped and fired thereby dividing the base of the appendix between staple lines. There appeared to be some tearing of the serosa distal to the stapler because of the inflamed nature of the tissue but the staple line did appear intact at the base. A laparoscopic bag was then inserted through the [facility] cannula. The appendix was placed within the bag and the bag was sealed. The abdomen was then irrigated with copious amounts of saline until the effluent was clear. No other abnormalities were noted. The appendix and bag were removed with the [facility] cannula through the infraumbilical port without difficulty.  The fascial defect was closed with the previously placed Vicryl pursestring stitch as well as with another interrupted 0 Vicryl figure-of-eight stitch. The rest of the ports were removed under direct vision and were found to be hemostatic. The gas was allowed to escape. The skin incisions were closed with  interrupted 4-0 Monocryl subcuticular stitches. Dermabond dressings were applied. The patient tolerated the procedure well. At the end of the case all needle sponge and instrument counts were correct. The patient was then awakened and taken to recovery in stable condition.   PLAN OF CARE: Admit for overnight observation  PATIENT DISPOSITION:  PACU - hemodynamically stable.   Delay start of Pharmacological VTE agent (>24hrs) due to surgical blood loss or risk of bleeding: no

## 2020-11-16 NOTE — Anesthesia Preprocedure Evaluation (Addendum)
Anesthesia Evaluation  Patient identified by MRN, date of birth, ID band Patient awake    Reviewed: Allergy & Precautions, NPO status , Patient's Chart, lab work & pertinent test results  History of Anesthesia Complications Negative for: history of anesthetic complications  Airway Mallampati: II  TM Distance: >3 FB Neck ROM: Full    Dental no notable dental hx.    Pulmonary neg pulmonary ROS,    Pulmonary exam normal        Cardiovascular negative cardio ROS Normal cardiovascular exam     Neuro/Psych  Headaches,    GI/Hepatic Neg liver ROS, Acute appendicitis   Endo/Other  negative endocrine ROS  Renal/GU negative Renal ROS  negative genitourinary   Musculoskeletal negative musculoskeletal ROS (+)   Abdominal   Peds  Hematology negative hematology ROS (+)   Anesthesia Other Findings Day of surgery medications reviewed with patient.  Reproductive/Obstetrics negative OB ROS                            Anesthesia Physical Anesthesia Plan  ASA: 1 and emergent  Anesthesia Plan: General   Post-op Pain Management:    Induction: Intravenous  PONV Risk Score and Plan: 4 or greater and Treatment may vary due to age or medical condition, Ondansetron, Dexamethasone, Midazolam and Scopolamine patch - Pre-op  Airway Management Planned: Oral ETT  Additional Equipment: None  Intra-op Plan:   Post-operative Plan: Extubation in OR  Informed Consent: I have reviewed the patients History and Physical, chart, labs and discussed the procedure including the risks, benefits and alternatives for the proposed anesthesia with the patient or authorized representative who has indicated his/her understanding and acceptance.     Dental advisory given  Plan Discussed with: CRNA  Anesthesia Plan Comments:        Anesthesia Quick Evaluation

## 2020-11-16 NOTE — Anesthesia Postprocedure Evaluation (Signed)
Anesthesia Post Note  Patient: Marisa Hawkins  Procedure(s) Performed: APPENDECTOMY LAPAROSCOPIC (Abdomen)     Patient location during evaluation: PACU Anesthesia Type: General Level of consciousness: awake and alert and oriented Pain management: pain level controlled Vital Signs Assessment: post-procedure vital signs reviewed and stable Respiratory status: spontaneous breathing, nonlabored ventilation and respiratory function stable Cardiovascular status: blood pressure returned to baseline Postop Assessment: no apparent nausea or vomiting Anesthetic complications: no   No notable events documented.  Last Vitals:  Vitals:   11/16/20 1840 11/16/20 1900  BP:  121/82  Pulse: 98 88  Resp: 17 18  Temp: 37.2 C 36.7 C  SpO2: 98% 100%    Last Pain:  Vitals:   11/16/20 1900  TempSrc: Oral  PainSc:                  Kaylyn Layer

## 2020-11-16 NOTE — ED Triage Notes (Signed)
2 days abdominal pain, gradually. Slight fever. No other symptoms

## 2020-11-16 NOTE — H&P (Signed)
Marisa GrainKrista Hawkins is an 45 y.o. female.   Chief Complaint: abd pain HPI: The patient is a 45 year old white female who presents with lower abdominal pain for the last 2 days.  It initially was on both sides but over the last day has moved to the right lower quadrant.  She has had some fever at home.  She has had an episode of nausea and vomiting as well.  She came to the emergency department where a CT scan showed what appears to be acute appendicitis.  She is otherwise in good health.  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 She has never used smokeless tobacco. She reports current alcohol use of about 3.0 standard drinks of alcohol per week. She reports that she does not use drugs.  Allergies: No Known Allergies  (Not in a hospital admission)   Results for orders placed or performed during the hospital encounter of 11/16/20 (from the past 48 hour(s))  Urinalysis, Routine w reflex microscopic Urine, Clean Catch     Status: Abnormal   Collection Time: 11/16/20 11:05 AM  Result Value Ref Range   Color, Urine YELLOW YELLOW   APPearance HAZY (A) CLEAR   Specific Gravity, Urine 1.013 1.005 - 1.030   pH 6.0 5.0 - 8.0   Glucose, UA NEGATIVE NEGATIVE mg/dL   Hgb urine  dipstick TRACE (A) NEGATIVE   Bilirubin Urine NEGATIVE NEGATIVE   Ketones, ur 15 (A) NEGATIVE mg/dL   Protein, ur TRACE (A) NEGATIVE mg/dL   Nitrite NEGATIVE NEGATIVE   Leukocytes,Ua NEGATIVE NEGATIVE   RBC / HPF 0-5 0 - 5 RBC/hpf   WBC, UA 0-5 0 - 5 WBC/hpf   Bacteria, UA MANY (A) NONE SEEN   Squamous Epithelial / LPF 0-5 0 - 5   Mucus PRESENT     Comment: Performed at [facility], [address], [place], [place] [address]  Pregnancy, urine     Status: None   Collection Time: 11/16/20 11:05 AM  Result Value Ref Range   Preg Test, Ur NEGATIVE NEGATIVE    Comment:        THE SENSITIVITY OF THIS METHODOLOGY IS >20 mIU/mL. Performed at [facility], [address], [place], [place] [address]   Lipase, blood     Status: Abnormal   Collection Time: 11/16/20 11:15 AM  Result Value Ref Range   Lipase <10 (L) 11 - 51 U/L    Comment: Performed at [facility], [address], [place], [place] [address]  Comprehensive metabolic panel     Status: Abnormal   Collection Time: 11/16/20 11:15  AM  Result Value Ref Range   Sodium 133 (L) 135 - 145 mmol/L   Potassium 3.5 3.5 - 5.1 mmol/L   Chloride 102 98 - 111 mmol/L   CO2 21 (L) 22 - 32 mmol/L   Glucose, Bld 163 (H) 70 - 99 mg/dL    Comment: Glucose reference range applies only to samples taken after fasting for at least 8 hours.   BUN <5 (L) 6 - 20 mg/dL   Creatinine, Ser 0.35 0.44 - 1.00 mg/dL   Calcium 9.0 8.9 - 46.5 mg/dL   Total Protein 6.9 6.5 - 8.1 g/dL   Albumin 4.2 3.5 - 5.0 g/dL   AST 10 (L) 15 - 41 U/L   ALT 13 0 - 44 U/L   Alkaline Phosphatase 46 38 - 126 U/L   Total Bilirubin 0.8 0.3 - 1.2 mg/dL   GFR, Estimated >68 >12 mL/min    Comment: (NOTE) Calculated using the CKD-EPI Creatinine Equation (2021)    Anion gap 10 5 - 15    Comment: Performed at [facility], [address] [place], [place] [address]  CBC     Status: Abnormal    Collection Time: 11/16/20 11:15 AM  Result Value Ref Range   WBC 15.1 (H) 4.0 - 10.5 K/uL   RBC 4.25 3.87 - 5.11 MIL/uL   Hemoglobin 13.3 12.0 - 15.0 g/dL   HCT 01.7 49.4 - 49.6 %   MCV 91.8 80.0 - 100.0 fL   MCH 31.3 26.0 - 34.0 pg   MCHC 34.1 30.0 - 36.0 g/dL   RDW 75.9 16.3 - 84.6 %   Platelets 242 150 - 400 K/uL   nRBC 0.0 0.0 - 0.2 %    Comment: Performed at [facility], [address] [place], [place] [address]  Resp Panel by RT-PCR (Flu A&B, Covid) Nasopharyngeal Swab     Status: None   Collection Time: 11/16/20 11:51 AM   Specimen: Nasopharyngeal Swab; Nasopharyngeal(NP) swabs in vial transport medium  Result Value Ref Range   SARS Coronavirus 2 by RT PCR NEGATIVE NEGATIVE    Comment: (NOTE) SARS-CoV-2 target nucleic acids are NOT DETECTED.  The SARS-CoV-2 RNA is generally detectable in upper respiratory specimens during the acute phase of infection. The lowest concentration of SARS-CoV-2 viral copies this assay can detect is 138 copies/mL. A negative result does not preclude SARS-Cov-2 infection and should not be used as the sole basis for treatment or other patient management decisions. A negative result may occur with  improper specimen collection/handling, submission of specimen other than nasopharyngeal swab, presence of viral mutation(s) within the areas targeted by this assay, and inadequate number of viral copies(<138 copies/mL). A negative result must be combined with clinical observations, patient history, and epidemiological information. The expected result is Negative.  Fact Sheet for Patients:  BloggerCourse.com  Fact Sheet for Healthcare Providers:  SeriousBroker.it  This test is no t yet approved or cleared by the [place] FDA and  has been authorized for detection and/or diagnosis of SARS-CoV-2 by FDA under an Emergency Use Authorization (EUA). This EUA will remain  in effect  (meaning this test can be used) for the duration of the COVID-19 declaration under Section 564(b)(1) of the Act, 21 U.S.C.section 360bbb-3(b)(1), unless the authorization is terminated  or revoked sooner.       Influenza A by PCR NEGATIVE NEGATIVE   Influenza B by PCR NEGATIVE NEGATIVE    Comment: (NOTE) The Xpert Xpress SARS-CoV-2/FLU/RSV plus assay is intended  as an aid in the diagnosis of influenza from Nasopharyngeal swab specimens and should not be used as a sole basis for treatment. Nasal washings and aspirates are unacceptable for Xpert Xpress SARS-CoV-2/FLU/RSV testing.  Fact Sheet for Patients: BloggerCourse.com  Fact Sheet for Healthcare Providers: SeriousBroker.it  This test is not yet approved or cleared by the [place] FDA and has been authorized for detection and/or diagnosis of SARS-CoV-2 by FDA under an Emergency Use Authorization (EUA). This EUA will remain in effect (meaning this test can be used) for the duration of the COVID-19 declaration under Section 564(b)(1) of the Act, 21 U.S.C. section 360bbb-3(b)(1), unless the authorization is terminated or revoked.  Performed at [facility], [address] [place], [place] [address]   Lactic acid, plasma     Status: None   Collection Time: 11/16/20  1:58 PM  Result Value Ref Range   Lactic Acid, Venous 0.9 0.5 - 1.9 mmol/L    Comment: Performed at [facility], [address], [place], [place] [address]   CT ABDOMEN PELVIS W CONTRAST  Result Date: 11/16/2020 CLINICAL DATA:  Abdominal abscess/infection suspected. Abdominal pain. Slight fever. EXAM: CT ABDOMEN AND PELVIS WITH CONTRAST TECHNIQUE: Multidetector CT imaging of the abdomen and pelvis was performed using the standard protocol following bolus administration of intravenous contrast. CONTRAST:  7mL OMNIPAQUE IOHEXOL 350 MG/ML SOLN COMPARISON:  None FINDINGS:  Lower chest: Lung bases are clear. Hepatobiliary: Normal appearance of the liver, gallbladder and portal venous system. No biliary dilatation. Pancreas: Unremarkable. No pancreatic ductal dilatation or surrounding inflammatory changes. Spleen: Normal in size without focal abnormality. Adrenals/Urinary Tract: Normal adrenal glands. Normal appearance of the urinary bladder. Normal appearance of both kidneys. Mild fullness of the right renal collecting system. Right renal collecting system fullness is likely secondary to the inflammatory changes in the right lower abdomen. Stomach/Bowel: Inflammatory changes in the right lower abdomen and right upper pelvic region. Inflammation is centered around a distended and abnormal appearing appendix. Appendix measures up to 1.6 cm in diameter on sequence 2, image 62. Small high-density material within the appendix is suggestive for an appendicolith. There is an abnormal adjacent loop of small bowel with wall thickening on sequence 2 image 58. Cannot exclude inflammation in the adjacent right adnexa. No discrete abscess collections or extraluminal gas. Normal appearance of the stomach. Vascular/Lymphatic: No significant vascular findings are present. No enlarged abdominal or pelvic lymph nodes. Reproductive: Normal appearance of the uterus and left adnexa. Cannot exclude inflammatory changes around the right adnexa due to the adjacent appendicitis. Other: Trace free fluid in the pelvis. Extensive inflammatory changes in the right upper pelvis and right lower quadrant of the abdomen. Small amount of fluid in the right lower quadrant along the anterior aspect of the right psoas muscle. Negative for free air. Musculoskeletal: No acute bone abnormality. IMPRESSION: Acute appendicitis. The appendix is markedly abnormal with a large amount of inflammation centered around the appendix and adjacent structures. Findings are suggestive for a perforated appendicitis based on the amount of  inflammation in this area. In addition, there is wall thickening involving an adjacent loop of small bowel compatible with secondary enteritis. Difficult to exclude inflammation involving the right adnexa. These results were called by telephone at the time of interpretation on 11/16/2020 at 1:28 pm to provider ADAM CURATOLO , who verbally acknowledged these results. Electronically Signed   By: Richarda Overlie M.D.   On: 11/16/2020 13:30    Review of Systems  Constitutional:  Positive  for fever.  HENT: Negative.    Eyes: Negative.   Respiratory: Negative.    Cardiovascular: Negative.   Gastrointestinal:  Positive for abdominal pain, nausea and vomiting.  Endocrine: Negative.   Genitourinary: Negative.   Musculoskeletal: Negative.   Skin: Negative.   Allergic/Immunologic: Negative.   Neurological: Negative.   Hematological: Negative.   Psychiatric/Behavioral: Negative.     Blood pressure (!) 110/98, pulse 98, temperature 98.7 F (37.1 C), temperature source Oral, resp. rate 20, height 5' (1.524 m), weight 63.5 kg, SpO2 100 %. Physical Exam Constitutional:      General: She is not in acute distress.    Appearance: Normal appearance. She is well-developed.  HENT:     Head: Normocephalic and atraumatic.     Right Ear: External ear normal.     Left Ear: External ear normal.     Nose: Nose normal.     Mouth/Throat:     Mouth: Mucous membranes are moist.     Pharynx: Oropharynx is clear. No oropharyngeal exudate.  Eyes:     General: No scleral icterus.    Extraocular Movements: Extraocular movements intact.     Conjunctiva/sclera: Conjunctivae normal.     Pupils: Pupils are equal, round, and reactive to light.  Cardiovascular:     Rate and Rhythm: Normal rate and regular rhythm.     Pulses: Normal pulses.     Heart sounds: Normal heart sounds.  Pulmonary:     Effort: Pulmonary effort is normal. No respiratory distress.     Breath sounds: Normal breath sounds. No stridor.  Abdominal:      General: Abdomen is flat.     Palpations: Abdomen is soft.     Tenderness: There is abdominal tenderness.     Comments: There is mild to moderate RLQ tenderness but no peritonitis  Musculoskeletal:        General: No swelling or deformity. Normal range of motion.     Cervical back: Normal range of motion and neck supple. No tenderness.  Skin:    General: Skin is warm and dry.     Coloration: Skin is not jaundiced.     Findings: No rash.  Neurological:     General: No focal deficit present.     Mental Status: She is alert and oriented to person, place, and time.  Psychiatric:        Mood and Affect: Mood normal.        Behavior: Behavior normal.        Thought Content: Thought content normal.     Assessment/Plan The patient appears to have acute appendicitis.  Because of the risk of perforation and sepsis I think she would benefit from having her appendix removed.  She would also like to have this done.  I have discussed with her in detail the risks and benefits of the operation as well as some of the technical aspects including the risk of leak and the need for open surgery and she understands and wishes to proceed.  We will cover her with broad-spectrum antibiotic therapy and plan for surgery this afternoon  Chevis Pretty III, MD 11/16/2020, 4:32 PM

## 2020-11-16 NOTE — ED Notes (Signed)
Report called to charge  [facility].

## 2020-11-16 NOTE — Plan of Care (Signed)
Plan of care discussed.   

## 2020-11-16 NOTE — ED Notes (Signed)
Blood cultures obtained

## 2020-11-16 NOTE — Transfer of Care (Signed)
Immediate Anesthesia Transfer of Care Note  Patient: Marisa Hawkins  Procedure(s) Performed: APPENDECTOMY LAPAROSCOPIC (Abdomen)  Patient Location: PACU  Anesthesia Type:General  Level of Consciousness: awake, alert , oriented and patient cooperative  Airway & Oxygen Therapy: Patient Spontanous Breathing and Patient connected to nasal cannula oxygen  Post-op Assessment: Report given to RN and Post -op Vital signs reviewed and stable  Post vital signs: Reviewed and stable  Last Vitals:  Vitals Value Taken Time  BP 93/81 11/16/20 1811  Temp 36.6 C 11/16/20 1812  Pulse 102 11/16/20 1814  Resp 17 11/16/20 1814  SpO2 100 % 11/16/20 1814  Vitals shown include unvalidated device data.  Last Pain:  Vitals:   11/16/20 1453  TempSrc: Oral         Complications: No notable events documented.

## 2020-11-16 NOTE — ED Provider Notes (Signed)
[facility] Provider [facility]   CSN: [id number] Arrival date & time: 11/16/20  1047     History Chief Complaint  Patient presents with   Abdominal Pain    Marisa GrainKrista Hawkins is a 45 y.o. female.  The history is provided by the patient.  Abdominal Pain Pain location:  LLQ, RLQ and suprapubic Pain quality: cramping   Pain quality: not aching   Pain radiates to:  Does not radiate Pain severity:  Mild Onset quality:  Gradual Duration:  3 days Timing:  Constant Progression:  Worsening Chronicity:  New Context: not previous surgeries   Relieved by:  Nothing Worsened by:  Nothing Associated symptoms: fever   Associated symptoms: no anorexia, no belching, no chest pain, no chills, no constipation, no cough, no diarrhea, no dysuria, no fatigue, no flatus, no hematuria, no nausea, no shortness of breath, no sore throat, no vaginal bleeding, no vaginal discharge and no vomiting   Risk factors: has not had multiple surgeries and not pregnant       Past Medical History:  Diagnosis Date   Abnormal Pap smear    Hx colposcopy and LEEP procedure 2010--Pos HR HPV, 05-06-2018 LGSIL HPV HR+   Migraines    Migraines as a teenager with aura   Seasonal allergies    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    Patient Active Problem List   Diagnosis Date Noted   History of loop electrical excision procedure (LEEP) 03/17/2013   Well woman exam with routine gynecological exam 03/17/2013   Breast lump on left side at 4 o'clock position 08/19/2012   Lump or mass in breast 08/19/2012    Past Surgical History:  Procedure Laterality Date   BREAST SURGERY     breast reduction   CERVICAL BIOPSY  W/ LOOP ELECTRODE EXCISION  2010   colposcopy and LEEP       OB History     Gravida  0   Para  0   Term  0   Preterm  0   AB  0   Living  0      SAB  0   IAB  0   Ectopic  0   Multiple  0   Live Births  0           Family History  Problem  Relation Age of Onset   Arthritis Mother    Diabetes Mother    Hyperlipidemia Mother    Osteoarthritis Mother    Heart disease Maternal Grandmother    Cancer Maternal Grandfather    Mental illness Paternal Grandmother    Heart disease Paternal Grandfather     Social History   Tobacco Use   Smoking status: Never   Smokeless tobacco: Never  Vaping Use   Vaping Use: Never used  Substance Use Topics   Alcohol use: Yes    Alcohol/week: 3.0 standard drinks    Types: 3 Standard drinks or equivalent per week   Drug use: No    Home Medications Prior to Admission medications   Medication Sig Start Date End Date Taking? Authorizing Provider  norethindrone (MICRONOR) 0.35 MG tablet Take 1 tablet (0.35 mg total) by mouth daily. 05/15/20  Yes Romualdo BolkJertson, Jill Evelyn, MD    Allergies    Patient has no known allergies.  Review of Systems   Review of Systems  Constitutional:  Positive for fever. Negative for chills and fatigue.  HENT:  Negative for ear pain and sore  throat.   Eyes:  Negative for pain and visual disturbance.  Respiratory:  Negative for cough and shortness of breath.   Cardiovascular:  Negative for chest pain and palpitations.  Gastrointestinal:  Positive for abdominal pain. Negative for anorexia, constipation, diarrhea, flatus, nausea and vomiting.  Genitourinary:  Negative for dysuria, hematuria, vaginal bleeding and vaginal discharge.  Musculoskeletal:  Negative for arthralgias and back pain.  Skin:  Negative for color change and rash.  Neurological:  Negative for seizures and syncope.  All other systems reviewed and are negative.  Physical Exam Updated Vital Signs BP 120/83 (BP Location: Left Arm)   Pulse 96   Temp 98.4 F (36.9 C) (Oral)   Resp 18   Ht 5' (1.524 m)   Wt 63.5 kg   SpO2 99%   BMI 27.34 kg/m   Physical Exam Vitals and nursing note reviewed.  Constitutional:      General: She is not in acute distress.    Appearance: She is well-developed.  She is not ill-appearing.  HENT:     Head: Normocephalic and atraumatic.     Mouth/Throat:     Mouth: Mucous membranes are moist.  Eyes:     Extraocular Movements: Extraocular movements intact.     Conjunctiva/sclera: Conjunctivae normal.     Pupils: Pupils are equal, round, and reactive to light.  Cardiovascular:     Rate and Rhythm: Normal rate and regular rhythm.     Pulses: Normal pulses.     Heart sounds: Normal heart sounds. No murmur heard. Pulmonary:     Effort: Pulmonary effort is normal. No respiratory distress.     Breath sounds: Normal breath sounds.  Abdominal:     General: Abdomen is flat. There is no distension.     Palpations: Abdomen is soft.     Tenderness: There is abdominal tenderness in the right lower quadrant, suprapubic area and left lower quadrant.     Hernia: No hernia is present.  Musculoskeletal:     Cervical back: Neck supple.  Skin:    General: Skin is warm and dry.     Capillary Refill: Capillary refill takes less than 2 seconds.  Neurological:     General: No focal deficit present.     Mental Status: She is alert.    ED Results / Procedures / Treatments   Labs (all labs ordered are listed, but only abnormal results are displayed) Labs Reviewed  LIPASE, BLOOD - Abnormal; Notable for the following components:      Result Value   Lipase <10 (*)    All other components within normal limits  COMPREHENSIVE METABOLIC PANEL - Abnormal; Notable for the following components:   Sodium 133 (*)    CO2 21 (*)    Glucose, Bld 163 (*)    BUN <5 (*)    AST 10 (*)    All other components within normal limits  CBC - Abnormal; Notable for the following components:   WBC 15.1 (*)    All other components within normal limits  URINALYSIS, ROUTINE W REFLEX MICROSCOPIC - Abnormal; Notable for the following components:   APPearance HAZY (*)    Hgb urine dipstick TRACE (*)    Ketones, ur 15 (*)    Protein, ur TRACE (*)    Bacteria, UA MANY (*)    All other  components within normal limits  RESP PANEL BY RT-PCR (FLU A&B, COVID) ARPGX2  URINE CULTURE  CULTURE, BLOOD (ROUTINE X 2)  CULTURE, BLOOD (ROUTINE X  2)  PREGNANCY, URINE  LACTIC ACID, PLASMA  LACTIC ACID, PLASMA    EKG None  Radiology CT ABDOMEN PELVIS W CONTRAST  Result Date: 11/16/2020 CLINICAL DATA:  Abdominal abscess/infection suspected. Abdominal pain. Slight fever. EXAM: CT ABDOMEN AND PELVIS WITH CONTRAST TECHNIQUE: Multidetector CT imaging of the abdomen and pelvis was performed using the standard protocol following bolus administration of intravenous contrast. CONTRAST:  42mL OMNIPAQUE IOHEXOL 350 MG/ML SOLN COMPARISON:  None FINDINGS: Lower chest: Lung bases are clear. Hepatobiliary: Normal appearance of the liver, gallbladder and portal venous system. No biliary dilatation. Pancreas: Unremarkable. No pancreatic ductal dilatation or surrounding inflammatory changes. Spleen: Normal in size without focal abnormality. Adrenals/Urinary Tract: Normal adrenal glands. Normal appearance of the urinary bladder. Normal appearance of both kidneys. Mild fullness of the right renal collecting system. Right renal collecting system fullness is likely secondary to the inflammatory changes in the right lower abdomen. Stomach/Bowel: Inflammatory changes in the right lower abdomen and right upper pelvic region. Inflammation is centered around a distended and abnormal appearing appendix. Appendix measures up to 1.6 cm in diameter on sequence 2, image 62. Small high-density material within the appendix is suggestive for an appendicolith. There is an abnormal adjacent loop of small bowel with wall thickening on sequence 2 image 58. Cannot exclude inflammation in the adjacent right adnexa. No discrete abscess collections or extraluminal gas. Normal appearance of the stomach. Vascular/Lymphatic: No significant vascular findings are present. No enlarged abdominal or pelvic lymph nodes. Reproductive: Normal  appearance of the uterus and left adnexa. Cannot exclude inflammatory changes around the right adnexa due to the adjacent appendicitis. Other: Trace free fluid in the pelvis. Extensive inflammatory changes in the right upper pelvis and right lower quadrant of the abdomen. Small amount of fluid in the right lower quadrant along the anterior aspect of the right psoas muscle. Negative for free air. Musculoskeletal: No acute bone abnormality. IMPRESSION: Acute appendicitis. The appendix is markedly abnormal with a large amount of inflammation centered around the appendix and adjacent structures. Findings are suggestive for a perforated appendicitis based on the amount of inflammation in this area. In addition, there is wall thickening involving an adjacent loop of small bowel compatible with secondary enteritis. Difficult to exclude inflammation involving the right adnexa. These results were called by telephone at the time of interpretation on 11/16/2020 at 1:28 pm to provider Mehdi Gironda , who verbally acknowledged these results. Electronically Signed   By: Richarda Overlie M.D.   On: 11/16/2020 13:30    Procedures Procedures   Medications Ordered in ED Medications  piperacillin-tazobactam (ZOSYN) IVPB 3.375 g (has no administration in time range)  sodium chloride 0.9 % bolus 1,000 mL (1,000 mLs Intravenous New Bag/Given 11/16/20 1152)  iohexol (OMNIPAQUE) 350 MG/ML injection 100 mL (80 mLs Intravenous Contrast Given 11/16/20 1249)    ED Course  I have reviewed the triage vital signs and the nursing notes.  Pertinent labs & imaging results that were available during my care of the patient were reviewed by me and considered in my medical decision making (see chart for details).    MDM Rules/Calculators/A&P                           Gemma Ruan is here for abdominal pain.  Normal vitals except for mild tachycardia.  No fever.  Maybe low-grade temperature earlier today.  Took Tylenol prior to arrival.  Has  had lower abdominal pain for the last several  days.  Denies any vaginal bleeding, vaginal discharge, dysuria.  Denies any nausea, vomiting, diarrhea.  Has some tenderness in the lower abdomen.  Could be appendicitis versus bowel obstruction versus UTI.  Seems less likely to be cholecystitis.  We will check lab work including CT scan abdomen pelvis to further evaluate.  Pregnancy test negative.  Leukocytosis of 15.  Radiology called me on the phone as patient has acute appendicitis with suspected rupture/perforation.  There is inflammation surrounding the small bowel next to this.  We will give IV Zosyn, add blood cultures and lactic acid.  Does not meet sepsis criteria given normal vital signs.  Talked with Dr. Carolynne Edouard with general surgery who recommends transfer to [facility] emergency department for evaluation.  Dr. Rodena Medin aware of patient being transferred.  This chart was dictated using voice recognition software.  Despite best efforts to proofread,  errors can occur which can change the documentation meaning.   Final Clinical Impression(s) / ED Diagnoses Final diagnoses:  Acute appendicitis with perforation and localized peritonitis, unspecified whether abscess present, unspecified whether gangrene present    Rx / DC Orders ED Discharge Orders     None        Virgina Norfolk, DO 11/16/20 1341

## 2020-11-16 NOTE — Anesthesia Procedure Notes (Signed)
Procedure Name: Intubation Date/Time: 11/16/2020 5:05 PM Performed by: Raenette Rover, CRNA Pre-anesthesia Checklist: Patient identified, Emergency Drugs available, Suction available and Patient being monitored Patient Re-evaluated:Patient Re-evaluated prior to induction Oxygen Delivery Method: Circle system utilized Preoxygenation: Pre-oxygenation with 100% oxygen Induction Type: IV induction Ventilation: Mask ventilation without difficulty Laryngoscope Size: Mac and 3 Grade View: Grade I Tube type: Oral Tube size: 7.0 mm Number of attempts: 1 Airway Equipment and Method: Stylet Placement Confirmation: ETT inserted through vocal cords under direct vision, positive ETCO2 and breath sounds checked- equal and bilateral Secured at: 21 cm Tube secured with: Tape Dental Injury: Teeth and Oropharynx as per pre-operative assessment

## 2020-11-16 NOTE — ED Notes (Signed)
Patient transported to CT 

## 2020-11-17 ENCOUNTER — Encounter (HOSPITAL_COMMUNITY): Payer: Self-pay | Admitting: General Surgery

## 2020-11-17 LAB — URINE CULTURE: Culture: 20000 — AB

## 2020-11-17 NOTE — Progress Notes (Signed)
1 Day Post-Op   Subjective/Chief Complaint: Sore as expected Denies nausea Has not had breakfast yet   Objective: Vital signs in last 24 hours: Temp:  [97.6 F (36.4 C)-98.9 F (37.2 C)] 97.6 F (36.4 C) (07/24 0548) Pulse Rate:  [70-121] 74 (07/24 0548) Resp:  [16-25] 16 (07/24 0548) BP: (93-145)/(61-98) 97/67 (07/24 0548) SpO2:  [91 %-100 %] 100 % (07/24 0548) Weight:  [63.5 kg] 63.5 kg (07/23 1056) Last BM Date: 11/15/20  Intake/Output from previous day: 07/23 0701 - 07/24 0700 In: 3312 [P.O.:720; I.V.:2540.3; IV Piggyback:51.7] Out: 1140 [Urine:1120; Blood:20] Intake/Output this shift: No intake/output data recorded.  Exam: Awake and alert Looks comfortable Abdomen soft, appropriately tender  Lab Results:  Recent Labs    11/16/20 1115  WBC 15.1*  HGB 13.3  HCT 39.0  PLT 242   BMET Recent Labs    11/16/20 1115  NA 133*  K 3.5  CL 102  CO2 21*  GLUCOSE 163*  BUN <5*  CREATININE 0.62  CALCIUM 9.0   PT/INR No results for input(s): LABPROT, INR in the last 72 hours. ABG No results for input(s): PHART, HCO3 in the last 72 hours.  Invalid input(s): PCO2, PO2  Studies/Results: CT ABDOMEN PELVIS W CONTRAST  Result Date: 11/16/2020 CLINICAL DATA:  Abdominal abscess/infection suspected. Abdominal pain. Slight fever. EXAM: CT ABDOMEN AND PELVIS WITH CONTRAST TECHNIQUE: Multidetector CT imaging of the abdomen and pelvis was performed using the standard protocol following bolus administration of intravenous contrast. CONTRAST:  52mL OMNIPAQUE IOHEXOL 350 MG/ML SOLN COMPARISON:  None FINDINGS: Lower chest: Lung bases are clear. Hepatobiliary: Normal appearance of the liver, gallbladder and portal venous system. No biliary dilatation. Pancreas: Unremarkable. No pancreatic ductal dilatation or surrounding inflammatory changes. Spleen: Normal in size without focal abnormality. Adrenals/Urinary Tract: Normal adrenal glands. Normal appearance of the urinary bladder.  Normal appearance of both kidneys. Mild fullness of the right renal collecting system. Right renal collecting system fullness is likely secondary to the inflammatory changes in the right lower abdomen. Stomach/Bowel: Inflammatory changes in the right lower abdomen and right upper pelvic region. Inflammation is centered around a distended and abnormal appearing appendix. Appendix measures up to 1.6 cm in diameter on sequence 2, image 62. Small high-density material within the appendix is suggestive for an appendicolith. There is an abnormal adjacent loop of small bowel with wall thickening on sequence 2 image 58. Cannot exclude inflammation in the adjacent right adnexa. No discrete abscess collections or extraluminal gas. Normal appearance of the stomach. Vascular/Lymphatic: No significant vascular findings are present. No enlarged abdominal or pelvic lymph nodes. Reproductive: Normal appearance of the uterus and left adnexa. Cannot exclude inflammatory changes around the right adnexa due to the adjacent appendicitis. Other: Trace free fluid in the pelvis. Extensive inflammatory changes in the right upper pelvis and right lower quadrant of the abdomen. Small amount of fluid in the right lower quadrant along the anterior aspect of the right psoas muscle. Negative for free air. Musculoskeletal: No acute bone abnormality. IMPRESSION: Acute appendicitis. The appendix is markedly abnormal with a large amount of inflammation centered around the appendix and adjacent structures. Findings are suggestive for a perforated appendicitis based on the amount of inflammation in this area. In addition, there is wall thickening involving an adjacent loop of small bowel compatible with secondary enteritis. Difficult to exclude inflammation involving the right adnexa. These results were called by telephone at the time of interpretation on 11/16/2020 at 1:28 pm to provider ADAM CURATOLO , who verbally  acknowledged these results.  Electronically Signed   By: Richarda Overlie M.D.   On: 11/16/2020 13:30    Anti-infectives: Anti-infectives (From admission, onward)    Start     Dose/Rate Route Frequency Ordered Stop   11/16/20 2000  piperacillin-tazobactam (ZOSYN) IVPB 3.375 g  Status:  Discontinued        3.375 g 12.5 mL/hr over 240 Minutes Intravenous Every 8 hours 11/16/20 1901 11/16/20 1907   11/16/20 2000  piperacillin-tazobactam (ZOSYN) IVPB 3.375 g        3.375 g 12.5 mL/hr over 240 Minutes Intravenous Every 8 hours 11/16/20 1901 11/21/20 2159   11/16/20 1345  piperacillin-tazobactam (ZOSYN) IVPB 3.375 g        3.375 g 100 mL/hr over 30 Minutes Intravenous  Once 11/16/20 1330 11/16/20 1445       Assessment/Plan: s/p Procedure(s): APPENDECTOMY LAPAROSCOPIC (N/A)  Clear liquids, may advance to fulls if tolerated Continue IV antibiotics Hopefully home tomorrow is stable  LOS: 0 days    Abigail Miyamoto 11/17/2020

## 2020-11-17 NOTE — Plan of Care (Signed)
  Problem: Education: Goal: Knowledge of General Education information will improve Description: Including pain rating scale, medication(s)/side effects and non-pharmacologic comfort measures Outcome: Progressing   Problem: Activity: Goal: Risk for activity intolerance will decrease Outcome: Progressing   Problem: Pain Managment: Goal: General experience of comfort will improve Outcome: Progressing   

## 2020-11-18 LAB — CBC
HCT: 31.6 % — ABNORMAL LOW (ref 36.0–46.0)
HCT: 34.7 % — ABNORMAL LOW (ref 36.0–46.0)
Hemoglobin: 10 g/dL — ABNORMAL LOW (ref 12.0–15.0)
Hemoglobin: 11.2 g/dL — ABNORMAL LOW (ref 12.0–15.0)
MCH: 30.8 pg (ref 26.0–34.0)
MCH: 31.5 pg (ref 26.0–34.0)
MCHC: 31.6 g/dL (ref 30.0–36.0)
MCHC: 32.3 g/dL (ref 30.0–36.0)
MCV: 97.2 fL (ref 80.0–100.0)
MCV: 97.7 fL (ref 80.0–100.0)
Platelets: 179 10*3/uL (ref 150–400)
Platelets: 210 10*3/uL (ref 150–400)
RBC: 3.25 MIL/uL — ABNORMAL LOW (ref 3.87–5.11)
RBC: 3.55 MIL/uL — ABNORMAL LOW (ref 3.87–5.11)
RDW: 13.2 % (ref 11.5–15.5)
RDW: 13.1 % (ref 11.5–15.5)
WBC: 6.6 10*3/uL (ref 4.0–10.5)
WBC: 5.9 10*3/uL (ref 4.0–10.5)
nRBC: 0 % (ref 0.0–0.2)
nRBC: 0 % (ref 0.0–0.2)

## 2020-11-18 MED ORDER — METHOCARBAMOL 500 MG PO TABS: 500.0000 mg | ORAL_TABLET | Freq: Four times a day (QID) | ORAL | 0 refills | Status: DC | PRN

## 2020-11-18 MED ORDER — AMOXICILLIN-POT CLAVULANATE 875-125 MG PO TABS: 1.0000 | ORAL_TABLET | Freq: Two times a day (BID) | ORAL | 0 refills | Status: DC

## 2020-11-18 MED ORDER — ACETAMINOPHEN 500 MG PO TABS
1000.0000 mg | ORAL_TABLET | Freq: Four times a day (QID) | ORAL | Status: DC | PRN
Start: 2020-11-18 — End: 2020-11-18
  Administered 2020-11-18: 1000 mg via ORAL
  Filled 2020-11-18: qty 2

## 2020-11-18 MED ORDER — OXYCODONE HCL 5 MG PO TABS: 5.0000 mg | ORAL_TABLET | ORAL | Status: DC | PRN

## 2020-11-18 MED ORDER — OXYCODONE HCL 5 MG PO TABS: 5.0000 mg | ORAL_TABLET | Freq: Four times a day (QID) | ORAL | 0 refills | Status: DC | PRN

## 2020-11-18 MED ORDER — ACETAMINOPHEN 500 MG PO TABS
1000.0000 mg | ORAL_TABLET | Freq: Three times a day (TID) | ORAL | 0 refills | Status: DC | PRN
Start: 2020-11-18 — End: 2021-05-19

## 2020-11-18 MED ORDER — METHOCARBAMOL 500 MG PO TABS: 500.0000 mg | ORAL_TABLET | Freq: Four times a day (QID) | ORAL | Status: DC | PRN

## 2020-11-18 NOTE — Plan of Care (Signed)
  Problem: Health Behavior/Discharge Planning: Goal: Ability to manage health-related needs will improve Outcome: Progressing   Problem: Clinical Measurements: Goal: Ability to maintain clinical measurements within normal limits will improve Outcome: Progressing   Problem: Activity: Goal: Risk for activity intolerance will decrease Outcome: Progressing   Problem: Nutrition: Goal: Adequate nutrition will be maintained Outcome: Progressing   Problem: Coping: Goal: Level of anxiety will decrease Outcome: Progressing   Problem: Elimination: Goal: Will not experience complications related to bowel motility Outcome: Progressing   Problem: Pain Managment: Goal: General experience of comfort will improve Outcome: Progressing   

## 2020-11-18 NOTE — Progress Notes (Signed)
2 Days Post-Op  Subjective: CC: No real pain at rest. Did not use PRN meds yesterday. Having some soreness around her incisions when she mobilizes. Mobilizing in the halls. Voiding. Passing flatus. Tolerating fld without n/v. She is a Armed forces operational officer.   Objective: Vital signs in last 24 hours: Temp:  [98.2 F (36.8 C)-98.9 F (37.2 C)] 98.4 F (36.9 C) (07/25 0611) Pulse Rate:  [65-84] 65 (07/25 0611) Resp:  [16-18] 18 (07/25 0611) BP: (105-108)/(66-75) 108/75 (07/25 0611) SpO2:  [98 %-100 %] 98 % (07/25 0611) Last BM Date: 11/15/20  Intake/Output from previous day: 07/24 0701 - 07/25 0700 In: 2768.9 [P.O.:720; I.V.:1894.4; IV Piggyback:154.4] Out: -  Intake/Output this shift: No intake/output data recorded.  PE: Gen:  Alert, NAD, pleasant HEENT: EOM's intact, pupils equal and round Card:  RRR Pulm:  CTAB, no W/R/R, effort normal Abd: Soft, ND, appropriately tender +BS, incisions with glue intact appears well and are without drainage, bleeding, or signs of infection Ext:  No LE edema or calf tenderness Psych: A&Ox3  Skin: no rashes noted, warm and dry  Lab Results:  Recent Labs    11/16/20 1115 11/18/20 0306  WBC 15.1* 6.6  HGB 13.3 10.0*  HCT 39.0 31.6*  PLT 242 179   BMET Recent Labs    11/16/20 1115  NA 133*  K 3.5  CL 102  CO2 21*  GLUCOSE 163*  BUN <5*  CREATININE 0.62  CALCIUM 9.0   PT/INR No results for input(s): LABPROT, INR in the last 72 hours. CMP     Component Value Date/Time   NA 133 (L) 11/16/2020 1115   K 3.5 11/16/2020 1115   CL 102 11/16/2020 1115   CO2 21 (L) 11/16/2020 1115   GLUCOSE 163 (H) 11/16/2020 1115   BUN <5 (L) 11/16/2020 1115   CREATININE 0.62 11/16/2020 1115   CREATININE 0.87 05/15/2020 1630   CALCIUM 9.0 11/16/2020 1115   PROT 6.9 11/16/2020 1115   ALBUMIN 4.2 11/16/2020 1115   AST 10 (L) 11/16/2020 1115   ALT 13 11/16/2020 1115   ALKPHOS 46 11/16/2020 1115   BILITOT 0.8 11/16/2020 1115   GFRNONAA >60  11/16/2020 1115   Lipase     Component Value Date/Time   LIPASE <10 (L) 11/16/2020 1115    Studies/Results: CT ABDOMEN PELVIS W CONTRAST  Result Date: 11/16/2020 CLINICAL DATA:  Abdominal abscess/infection suspected. Abdominal pain. Slight fever. EXAM: CT ABDOMEN AND PELVIS WITH CONTRAST TECHNIQUE: Multidetector CT imaging of the abdomen and pelvis was performed using the standard protocol following bolus administration of intravenous contrast. CONTRAST:  42mL OMNIPAQUE IOHEXOL 350 MG/ML SOLN COMPARISON:  None FINDINGS: Lower chest: Lung bases are clear. Hepatobiliary: Normal appearance of the liver, gallbladder and portal venous system. No biliary dilatation. Pancreas: Unremarkable. No pancreatic ductal dilatation or surrounding inflammatory changes. Spleen: Normal in size without focal abnormality. Adrenals/Urinary Tract: Normal adrenal glands. Normal appearance of the urinary bladder. Normal appearance of both kidneys. Mild fullness of the right renal collecting system. Right renal collecting system fullness is likely secondary to the inflammatory changes in the right lower abdomen. Stomach/Bowel: Inflammatory changes in the right lower abdomen and right upper pelvic region. Inflammation is centered around a distended and abnormal appearing appendix. Appendix measures up to 1.6 cm in diameter on sequence 2, image 62. Small high-density material within the appendix is suggestive for an appendicolith. There is an abnormal adjacent loop of small bowel with wall thickening on sequence 2 image 58. Cannot exclude inflammation  in the adjacent right adnexa. No discrete abscess collections or extraluminal gas. Normal appearance of the stomach. Vascular/Lymphatic: No significant vascular findings are present. No enlarged abdominal or pelvic lymph nodes. Reproductive: Normal appearance of the uterus and left adnexa. Cannot exclude inflammatory changes around the right adnexa due to the adjacent appendicitis.  Other: Trace free fluid in the pelvis. Extensive inflammatory changes in the right upper pelvis and right lower quadrant of the abdomen. Small amount of fluid in the right lower quadrant along the anterior aspect of the right psoas muscle. Negative for free air. Musculoskeletal: No acute bone abnormality. IMPRESSION: Acute appendicitis. The appendix is markedly abnormal with a large amount of inflammation centered around the appendix and adjacent structures. Findings are suggestive for a perforated appendicitis based on the amount of inflammation in this area. In addition, there is wall thickening involving an adjacent loop of small bowel compatible with secondary enteritis. Difficult to exclude inflammation involving the right adnexa. These results were called by telephone at the time of interpretation on 11/16/2020 at 1:28 pm to provider ADAM CURATOLO , who verbally acknowledged these results. Electronically Signed   By: Richarda Overlie M.D.   On: 11/16/2020 13:30    Anti-infectives: Anti-infectives (From admission, onward)    Start     Dose/Rate Route Frequency Ordered Stop   11/16/20 2000  piperacillin-tazobactam (ZOSYN) IVPB 3.375 g  Status:  Discontinued        3.375 g 12.5 mL/hr over 240 Minutes Intravenous Every 8 hours 11/16/20 1901 11/16/20 1907   11/16/20 2000  piperacillin-tazobactam (ZOSYN) IVPB 3.375 g        3.375 g 12.5 mL/hr over 240 Minutes Intravenous Every 8 hours 11/16/20 1901 11/21/20 2159   11/16/20 1345  piperacillin-tazobactam (ZOSYN) IVPB 3.375 g        3.375 g 100 mL/hr over 30 Minutes Intravenous  Once 11/16/20 1330 11/16/20 1445        Assessment/Plan POD 2 s/p Laparoscopic appendectomy for acute appendicitis, Dr. Carolynne Edouard - 11/16/20 - Hgb 10 from 13.3 on admission. Suspect dilutional. Recheck labs this pm - Add oral medications to ensure tolerates - Adv diet - Cont abx - Mobilize, pulm toilet - If tolerates oral meds, diet and labs look okay this pm will plan for  discharge on 5 days of oral abx.   FEN - Soft diet, d/c IVF VTE - SCDs, lovenox ID - Zosyn    LOS: 0 days    Jacinto Halim , [facility] 11/18/2020, 8:19 AM Please see Amion for pager number during day hours 7:00am-4:30pm

## 2020-11-18 NOTE — Discharge Instructions (Signed)
[facility]  Please arrive at least 30 min before your appointment to complete your check in paperwork.  If you are unable to arrive 30 min prior to your appointment time we may have to cancel or reschedule you. LAPAROSCOPIC SURGERY: POST OP INSTRUCTIONS Always review your discharge instruction sheet given to you by the facility where your surgery was performed. IF YOU HAVE DISABILITY OR FAMILY LEAVE FORMS, YOU MUST BRING THEM TO THE OFFICE FOR PROCESSING.   DO NOT GIVE THEM TO YOUR DOCTOR.  PAIN CONTROL  First take acetaminophen (Tylenol) AND/or ibuprofen (Advil) to control your pain after surgery.  Follow directions on package.  Taking acetaminophen (Tylenol) and/or ibuprofen (Advil) regularly after surgery will help to control your pain and lower the amount of prescription pain medication you may need.  You should not take more than 4,000 mg (4 grams) of acetaminophen (Tylenol) in 24 hours.  You should not take ibuprofen (Advil), aleve, motrin, naprosyn or other NSAIDS if you have a history of stomach ulcers or chronic kidney disease.  A prescription for pain medication may be given to you upon discharge.  Take your pain medication as prescribed, if you still have uncontrolled pain after taking acetaminophen (Tylenol) or ibuprofen (Advil). Use ice packs to help control pain. If you need a refill on your pain medication, please contact your pharmacy.  They will contact our office to request authorization. Prescriptions will not be filled after 5pm or on week-ends.  HOME MEDICATIONS Take your usually prescribed medications unless otherwise directed.  DIET You should follow a light diet the first few days after arrival home.  Be sure to include lots of fluids daily. Avoid fatty, fried foods.   CONSTIPATION It is common to experience some constipation after surgery and if you are taking pain medication.  Increasing fluid intake and taking a stool softener (such as Colace)  will usually help or prevent this problem from occurring.  A mild laxative (Milk of Magnesia or Miralax) should be taken according to package instructions if there are no bowel movements after 48 hours.  WOUND/INCISION CARE Most patients will experience some swelling and bruising in the area of the incisions.  Ice packs will help.  Swelling and bruising can take several days to resolve.  Unless discharge instructions indicate otherwise, follow guidelines below  STERI-STRIPS - you may remove your outer bandages 48 hours after surgery, and you may shower at that time.  You have steri-strips (small skin tapes) in place directly over the incision.  These strips should be left on the skin for 7-10 days.   DERMABOND/SKIN GLUE - you may shower in 24 hours.  The glue will flake off over the next 2-3 weeks. Any sutures or staples will be removed at the office during your follow-up visit.  ACTIVITIES You may resume regular (light) daily activities beginning the next day--such as daily self-care, walking, climbing stairs--gradually increasing activities as tolerated.  You may have sexual intercourse when it is comfortable.  Refrain from any heavy lifting or straining until approved by your doctor. You may drive when you are no longer taking prescription pain medication, you can comfortably wear a seatbelt, and you can safely maneuver your car and apply brakes.  FOLLOW-UP You should see your doctor in the office for a follow-up appointment approximately 2-3 weeks after your surgery.  You should have been given your post-op/follow-up appointment when your surgery was scheduled.  If you did not receive a post-op/follow-up appointment, make sure   that you call for this appointment within a day or two after you arrive home to insure a convenient appointment time.   WHEN TO CALL YOUR DOCTOR: Fever over 101.0 Inability to urinate Continued bleeding from incision. Increased pain, redness, or drainage from the  incision. Increasing abdominal pain  The clinic staff is available to answer your questions during regular business hours.  Please don't hesitate to call and ask to speak to one of the nurses for clinical concerns.  If you have a medical emergency, go to the nearest emergency room or call 911.  A surgeon from [facility] is always on call at the hospital. [address], [address], [place], [place]  [address] ? [address] [address], [place], [place]   [address] [phone] ? [phone] ? [phone]  

## 2020-11-18 NOTE — Discharge Summary (Signed)
    Patient ID: Marisa Hawkins [id number] [birth year] 45 y.o.  Admit date: 11/16/2020 Discharge date: 11/18/2020  Admitting Diagnosis: Acute Appendicitis   Discharge Diagnosis Acute appendicitis s/p laparoscopic appendectomy   Consultants None  Reason for Admission: The patient is a 45 year old white female who presents with lower abdominal pain for the last 2 days.  It initially was on both sides but over the last day has moved to the right lower quadrant.  She has had some fever at home.  She has had an episode of nausea and vomiting as well.  She came to the emergency department where a CT scan showed what appears to be acute appendicitis.  She is otherwise in good health.  Procedures Dr. Carolynne Edouard - Laparoscopic Appendectomy - 11/16/2020  Hospital Course:  The patient was admitted and underwent a laparoscopic appendectomy.  The patient tolerated the procedure well.  On POD 2, the patient was tolerating a regular diet, voiding well, mobilizing, and pain was controlled with oral pain medications.  The patient was stable for DC home at this time with appropriate follow up made. She is to be discharge on 5 days of abx. Discharge and return precautions discussed. A not was provided for work.   Allergies as of 11/18/2020   No Known Allergies      Medication List     TAKE these medications    acetaminophen 500 MG tablet Commonly known as: TYLENOL Take 2 tablets (1,000 mg total) by mouth every 8 (eight) hours as needed for mild pain.   amoxicillin-clavulanate 875-125 MG tablet Commonly known as: AUGMENTIN Take 1 tablet by mouth every 12 (twelve) hours.   methocarbamol 500 MG tablet Commonly known as: ROBAXIN Take 1 tablet (500 mg total) by mouth every 6 (six) hours as needed for muscle spasms.   norethindrone 0.35 MG tablet Commonly known as: MICRONOR Take 1 tablet (0.35 mg total) by mouth daily.   oxyCODONE 5 MG immediate release tablet Commonly known as: Oxy IR/ROXICODONE Take  1 tablet (5 mg total) by mouth every 6 (six) hours as needed for breakthrough pain.          Follow-up Information     [facility]   Specialty: General Surgery Why: Please call to confirm your appointment time. We are working hard to make this for you. Please bring a copy of your photo ID and insurance card to your appointment. Please arrive 30 minutes prior to your appointment for paperwork. Contact information: [address] [place] [address] [phone]                 Signed: Leary Roca, [facility] 11/18/2020, 3:01 PM Please see Amion for pager number during day hours 7:00am-4:30pm

## 2020-11-18 NOTE — Plan of Care (Signed)
  Problem: Activity: Goal: Risk for activity intolerance will decrease Outcome: Progressing   Problem: Coping: Goal: Level of anxiety will decrease Outcome: Progressing   Problem: Pain Managment: Goal: General experience of comfort will improve Outcome: Progressing   

## 2020-11-18 NOTE — Progress Notes (Signed)
Provided discharge education/instructions, all questions and concerns addressed, Pt not in distress. Per pharmacy, IV abx can be discontinued since Pt will start taking PO abx. Pt discharged home with belongings accompanied by parents.

## 2020-11-18 NOTE — Progress Notes (Signed)
Pt tolerated soft diet well, denies pain at rest. Pt complained of 7/10 pain while ambulating, PRN tylenol given, pain improved to 4/10. Pt resting comfortably in the chair, parents at bedside.

## 2020-11-19 LAB — SURGICAL PATHOLOGY

## 2020-11-21 LAB — CULTURE, BLOOD (ROUTINE X 2)
Culture: NO GROWTH
Culture: NO GROWTH
Special Requests: ADEQUATE

## 2021-05-15 NOTE — Progress Notes (Signed)
46 y.o. G0P0000 Single White or Caucasian Not Hispanic or Latino female here for annual exam.  Patient states that her periods are irregular and very very light on the minipill.  Sexually active, same partner (don't live together).    No LMP recorded. (Menstrual status: Oral contraceptives).          Sexually active: Yes.    The current method of family planning is oral progesterone-only contraceptive.    Exercising: Yes.     Walking and step class  Smoker:  no  Health Maintenance: Pap: 05/15/20 normal, +HPV; 09/22/19 Colposcopy unsatisfactory, no abnormalities seen. 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 MMG:  05/01/16 BIRADS 2 benign/density b BMD:   n/a Colonoscopy: n/a TDaP:  2013 Gardasil: none, counseled.     reports that she has never smoked. She has never used smokeless tobacco. She reports current alcohol use of about 3.0 standard drinks per week. She reports that she does not use drugs. She is a Copywriter, advertising.   Past Medical History:  Diagnosis Date   Abnormal Pap smear    Hx colposcopy and LEEP procedure 2010--Pos HR HPV, 05-06-2018 LGSIL HPV HR+   Migraines    Migraines as a teenager with aura   Seasonal allergies    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    Past Surgical History:  Procedure Laterality Date   BREAST SURGERY     breast reduction   CERVICAL BIOPSY  W/ LOOP ELECTRODE EXCISION  2010   colposcopy and LEEP     LAPAROSCOPIC APPENDECTOMY N/A 11/16/2020   Procedure: APPENDECTOMY LAPAROSCOPIC;  Surgeon: Autumn Messing III, MD;  Location: [facility];  Service: General;  Laterality: N/A;    Current Outpatient Medications  Medication Sig Dispense Refill   norethindrone (MICRONOR) 0.35 MG tablet Take 1 tablet (0.35 mg total) by mouth daily. 84 tablet 4   No current facility-administered medications for  this visit.    Family History  Problem Relation Age of Onset   Arthritis Mother    Diabetes Mother    Hyperlipidemia Mother    Osteoarthritis Mother    Heart disease Maternal Grandmother    Cancer Maternal Grandfather    Mental illness Paternal Grandmother    Heart disease Paternal Grandfather     Review of Systems  All other systems reviewed and are negative.  Exam:   BP 110/62    Pulse 72    Ht 5' (1.524 m)    Wt 143 lb (64.9 kg)    SpO2 99%    BMI 27.93 kg/m   Weight change: @WEIGHTCHANGE @ Height:   Height: 5' (152.4 cm)  Ht Readings from Last 3 Encounters:  05/19/21 5' (1.524 m)  11/16/20 5' (1.524 m)  05/15/20 5' 0.5" (1.537 m)    General appearance: alert, cooperative and appears stated age Head: Normocephalic, without obvious abnormality, atraumatic Neck: no adenopathy, supple, symmetrical, trachea midline and thyroid normal to inspection and palpation Lungs: clear to auscultation bilaterally Cardiovascular: regular rate and rhythm Breasts: normal appearance, no masses or tenderness Abdomen: soft, non-tender; non distended,  no masses,  no organomegaly Extremities: extremities normal, atraumatic, no cyanosis or edema Skin: Skin color, texture, turgor normal. No  rashes or lesions Lymph nodes: Cervical, supraclavicular, and axillary nodes normal. No abnormal inguinal nodes palpated Neurologic: Grossly normal   Pelvic: External genitalia:  no lesions              Urethra:  normal appearing urethra with no masses, tenderness or lesions              Bartholins and Skenes: normal                 Vagina: normal appearing vagina with normal color and discharge, no lesions              Cervix: no lesions and very stenotic               Bimanual Exam:  Uterus:  normal size, contour, position, consistency, mobility, non-tender              Adnexa: no mass, fullness, tenderness               Rectovaginal: Confirms               Anus:  normal sphincter tone, no  lesions  Gae Dry chaperoned for the exam.  1. Well woman exam Discussed breast self exam Discussed calcium and vit D intake Mammogram overdue, she will schedule  2. Screening for cervical cancer H/o CIN I - Cytology - PAP  3. Immunization due - Tdap vaccine greater than or equal to 7yo IM  4. Immunization counseling - HPV 9-valent vaccine,Recombinat  5. Encounter for surveillance of contraceptive pills Doing well - norethindrone (MICRONOR) 0.35 MG tablet; Take 1 tablet (0.35 mg total) by mouth daily.  Dispense: 84 tablet; Refill: 4

## 2021-05-19 ENCOUNTER — Other Ambulatory Visit (HOSPITAL_COMMUNITY)
Admission: RE | Admit: 2021-05-19 | Discharge: 2021-05-19 | Disposition: A | Discharge status: Home / Self Care | Payer: 59 | Source: Ambulatory Visit | Attending: Obstetrics and Gynecology | Admitting: Obstetrics and Gynecology

## 2021-05-19 ENCOUNTER — Ambulatory Visit (INDEPENDENT_AMBULATORY_CARE_PROVIDER_SITE_OTHER): Payer: 59 | Admitting: Obstetrics and Gynecology

## 2021-05-19 ENCOUNTER — Encounter: Payer: Self-pay | Admitting: Obstetrics and Gynecology

## 2021-05-19 ENCOUNTER — Other Ambulatory Visit: Payer: Self-pay

## 2021-05-19 VITALS — BP 110/62 | HR 72 | Ht 60.0 in | Wt 143.0 lb

## 2021-05-19 DIAGNOSIS — Z01419 Encounter for gynecological examination (general) (routine) without abnormal findings: Secondary | ICD-10-CM

## 2021-05-19 DIAGNOSIS — Z124 Encounter for screening for malignant neoplasm of cervix: Secondary | ICD-10-CM | POA: Diagnosis present

## 2021-05-19 DIAGNOSIS — Z7185 Encounter for immunization safety counseling: Secondary | ICD-10-CM

## 2021-05-19 DIAGNOSIS — Z23 Encounter for immunization: Secondary | ICD-10-CM | POA: Diagnosis not present

## 2021-05-19 DIAGNOSIS — Z3041 Encounter for surveillance of contraceptive pills: Secondary | ICD-10-CM

## 2021-05-19 MED ORDER — NORETHINDRONE 0.35 MG PO TABS: 1.0000 | ORAL_TABLET | Freq: Every day | ORAL | 4 refills | Status: DC

## 2021-05-19 NOTE — Patient Instructions (Signed)

## 2021-05-22 LAB — CYTOLOGY - PAP
Comment: NEGATIVE
Diagnosis: NEGATIVE
High risk HPV: NEGATIVE

## 2021-07-18 ENCOUNTER — Ambulatory Visit (INDEPENDENT_AMBULATORY_CARE_PROVIDER_SITE_OTHER): Payer: 59 | Admitting: *Deleted

## 2021-07-18 ENCOUNTER — Other Ambulatory Visit: Payer: Self-pay

## 2021-07-18 DIAGNOSIS — Z23 Encounter for immunization: Secondary | ICD-10-CM

## 2021-11-04 IMAGING — CT CT ABD-PELV W/ CM
2 of 5 series · 15 of 46 positions shown, 17 images · IV contrast (omnipaque)
Comparison: None

CLINICAL DATA: Abdominal abscess/infection suspected. Abdominal
pain. Slight fever.

EXAM:
CT ABDOMEN AND PELVIS WITH CONTRAST
TECHNIQUE: Multidetector CT imaging of the abdomen and pelvis was performed
using the standard protocol following bolus administration of
intravenous contrast.
CONTRAST:  80mL OMNIPAQUE IOHEXOL 350 MG/ML SOLN

[Series 2: abd pel w · axial · 0.73mm/px · z∈[+691,+1066]mm · 12 of 87 slices shown, 14 images]
[im 6/87  soft-tissue]
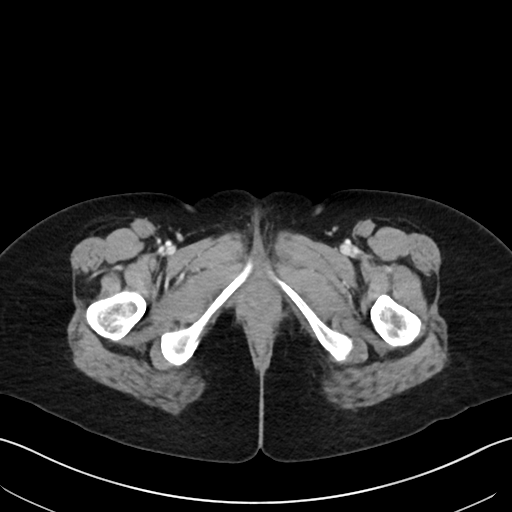
[im 6/87  bone]
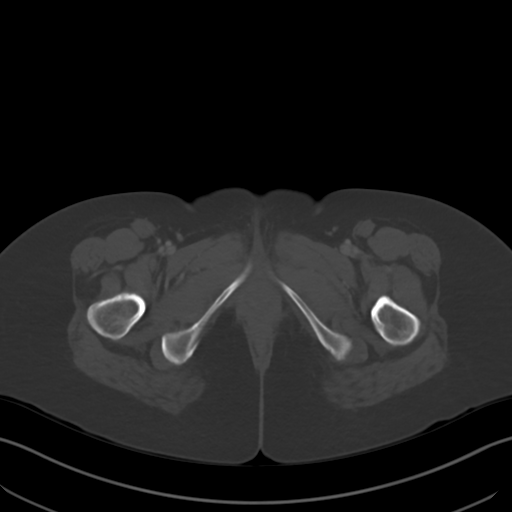
[im 16/87  soft-tissue]
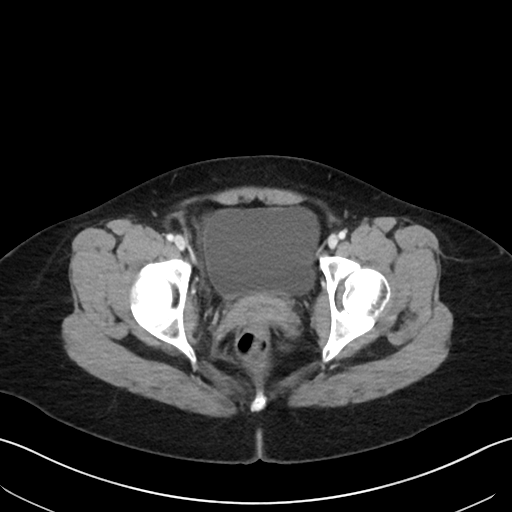
[im 21/87  soft-tissue]
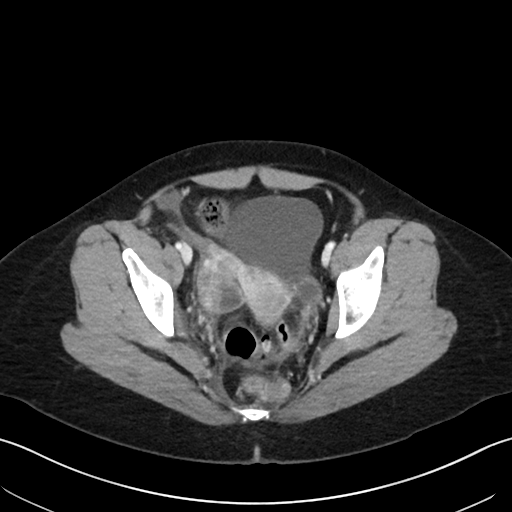
[im 26/87  soft-tissue]
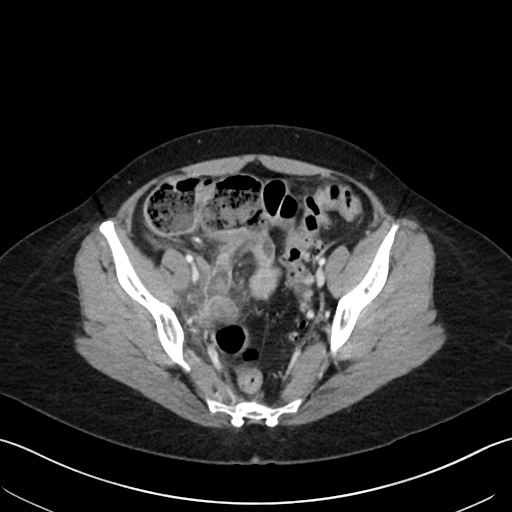
[im 36/87  soft-tissue]
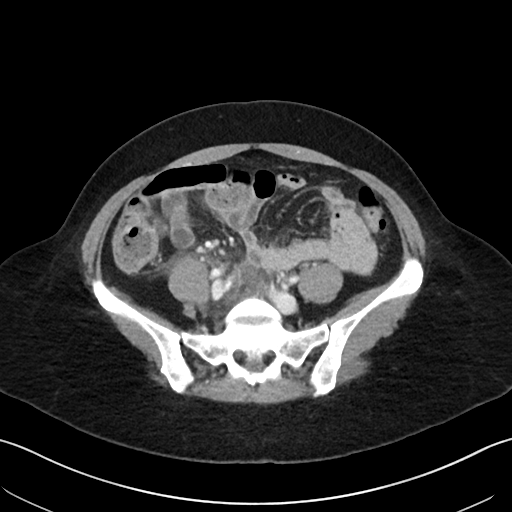
[im 41/87  soft-tissue]
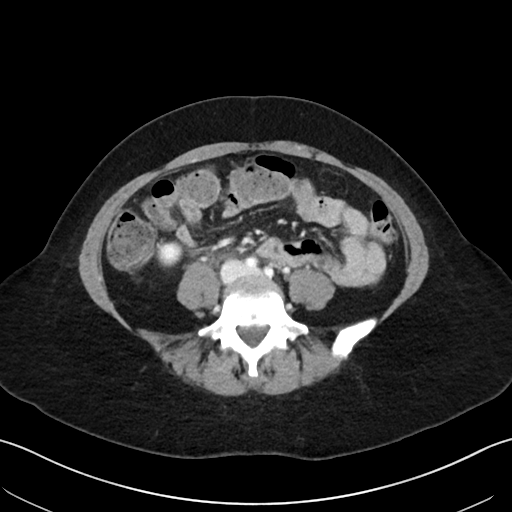
[im 46/87  soft-tissue]
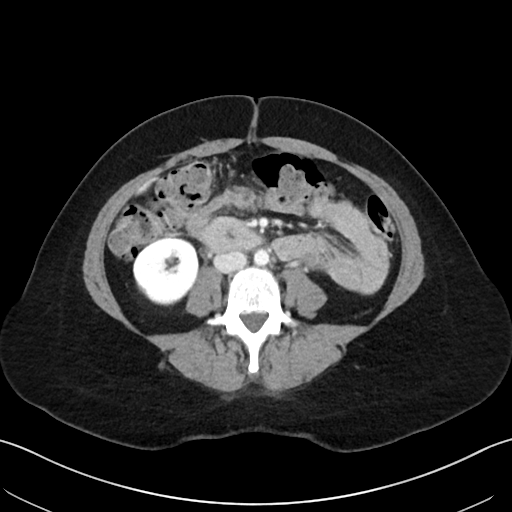
[im 56/87  soft-tissue]
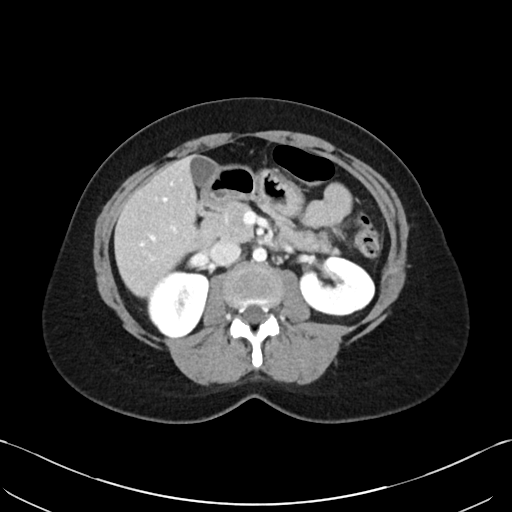
[im 61/87  soft-tissue]
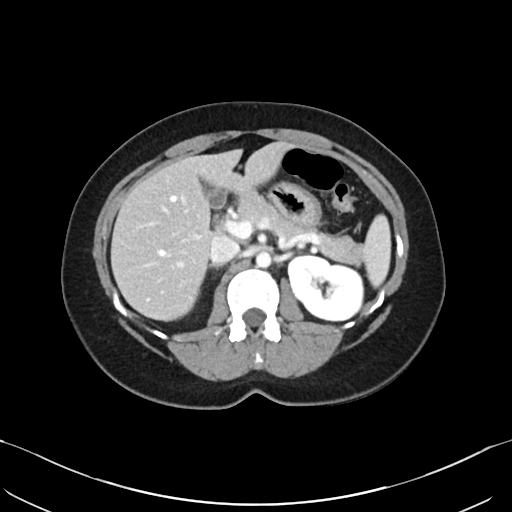
[im 61/87  bone]
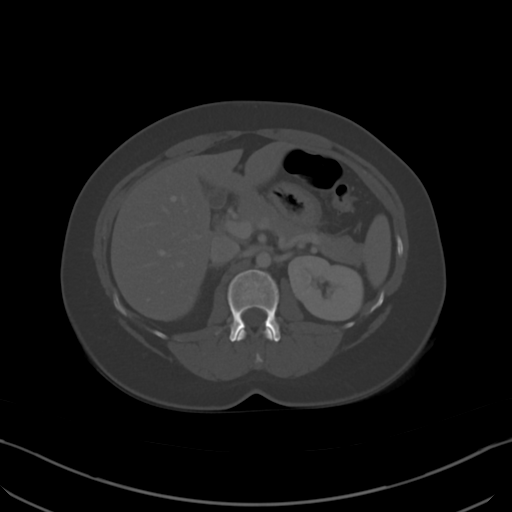
[im 66/87  soft-tissue]
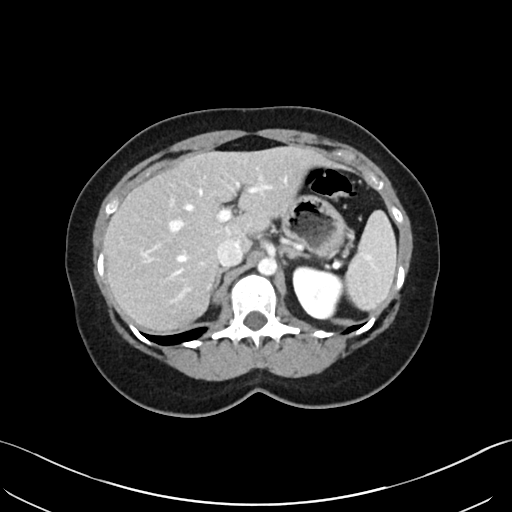
[im 76/87  soft-tissue]
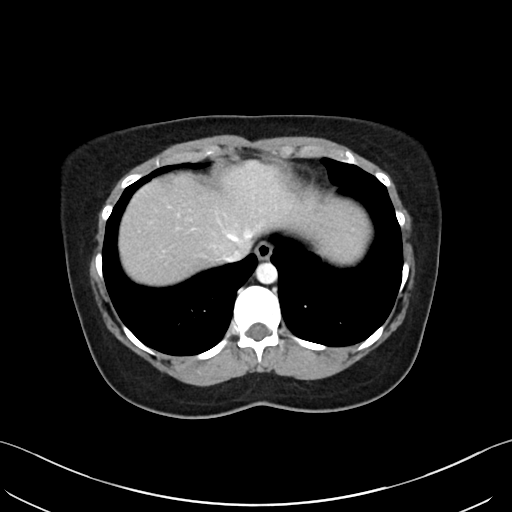
[im 81/87  soft-tissue]
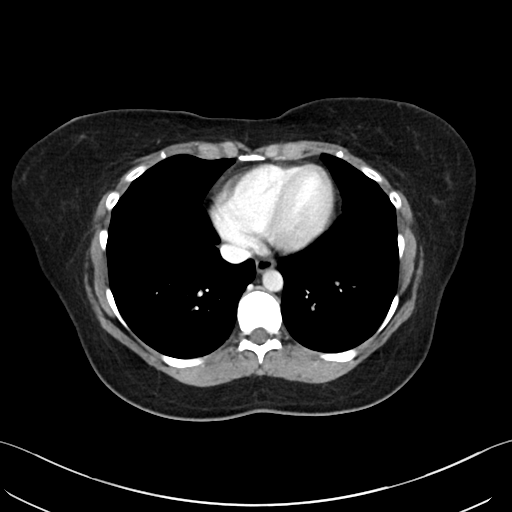

[Series 5: coronal · coronal · 0.62mm/px · 3 of 87 slices shown]
[im 29/87  soft-tissue]
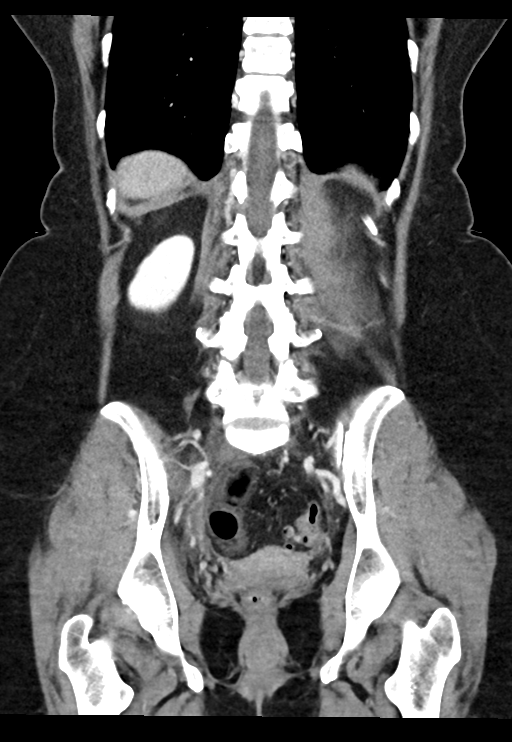
[im 39/87  soft-tissue]
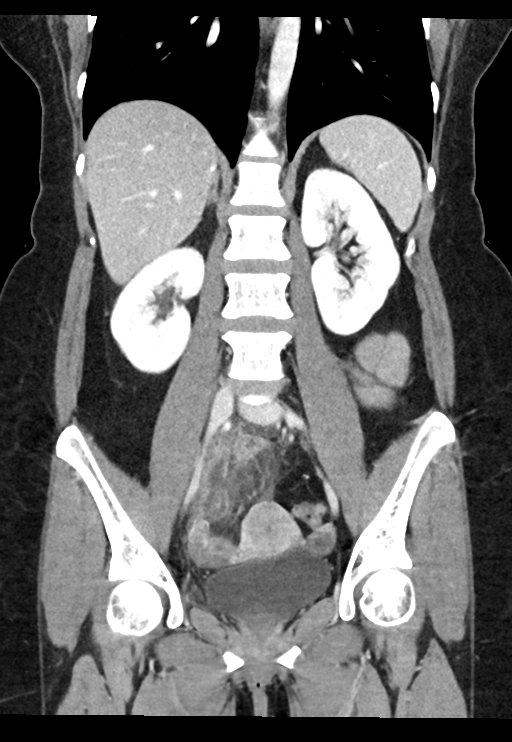
[im 48/87  soft-tissue]
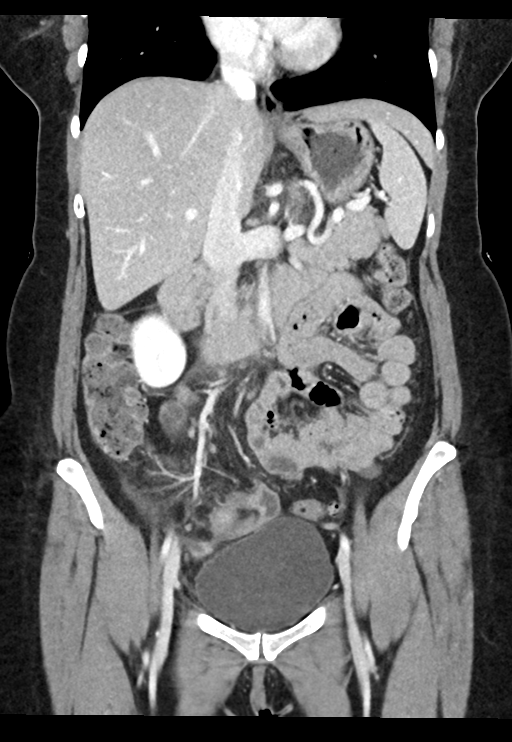

[15 of 46 positions shown; findings below may reference images not displayed]

FINDINGS: Lower chest: Lung bases are clear.

Hepatobiliary: Normal appearance of the liver, gallbladder and
portal venous system. No biliary dilatation.

Pancreas: Unremarkable. No pancreatic ductal dilatation or
surrounding inflammatory changes.

Spleen: Normal in size without focal abnormality.

Adrenals/Urinary Tract: Normal adrenal glands. Normal appearance of
the urinary bladder. Normal appearance of both kidneys. Mild
fullness of the right renal collecting system. Right renal
collecting system fullness is likely secondary to the inflammatory
changes in the right lower abdomen.

Stomach/Bowel: Inflammatory changes in the right lower abdomen and
right upper pelvic region. Inflammation is centered around a
distended and abnormal appearing appendix. Appendix measures up to
1.6 cm in diameter on sequence 2, image 62. Small high-density
material within the appendix is suggestive for an appendicolith.
There is an abnormal adjacent loop of small bowel with wall
thickening on sequence 2 image 58. Cannot exclude inflammation in
the adjacent right adnexa. No discrete abscess collections or
extraluminal gas. Normal appearance of the stomach.

Vascular/Lymphatic: No significant vascular findings are present. No
enlarged abdominal or pelvic lymph nodes.

Reproductive: Normal appearance of the uterus and left adnexa.
Cannot exclude inflammatory changes around the right adnexa due to
the adjacent appendicitis.

Other: Trace free fluid in the pelvis. Extensive inflammatory
changes in the right upper pelvis and right lower quadrant of the
abdomen. Small amount of fluid in the right lower quadrant along the
anterior aspect of the right psoas muscle. Negative for free air.

Musculoskeletal: No acute bone abnormality.
IMPRESSION: Acute appendicitis. The appendix is markedly abnormal with a large
amount of inflammation centered around the appendix and adjacent
structures. Findings are suggestive for a perforated appendicitis
based on the amount of inflammation in this area. In addition, there
is wall thickening involving an adjacent loop of small bowel
compatible with secondary enteritis. Difficult to exclude
inflammation involving the right adnexa.

These results were called by telephone at the time of interpretation
on 11/16/2020 at [DATE] to provider SOURABH LIPPERT , who verbally
acknowledged these results.

## 2021-11-21 ENCOUNTER — Ambulatory Visit (INDEPENDENT_AMBULATORY_CARE_PROVIDER_SITE_OTHER): Payer: 59 | Admitting: *Deleted

## 2021-11-21 DIAGNOSIS — Z23 Encounter for immunization: Secondary | ICD-10-CM | POA: Diagnosis not present

## 2022-05-14 NOTE — Progress Notes (Unsigned)
47 y.o. G0P0000 Single White or Caucasian Not Hispanic or Latino female here for annual exam.  She is on the mini-pill, no cycles just occasional cycles. Same long term partner (don't live together), no dyspareunia.  No bowel or bladder issues.     No LMP recorded. (Menstrual status: Oral contraceptives).          Sexually active: Yes.    The current method of family planning is POP (progesterone only).    Exercising: Yes.     Walking. Smoker:  no  Health Maintenance: Pap:  05/19/21 WNL Hr hpv Neg; 05/15/20 normal, +HPV; 09/22/19 Colposcopy unsatisfactory, no abnormalities seen. 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 MMG: 05/01/16 BIRADS 2 benign/density b, scheduled on 06/05/22 BMD:   n/a Colonoscopy: n/a TDaP:  05/19/21 Gardasil: complete    reports that she has never smoked. She has never used smokeless tobacco. She reports current alcohol use of about 3.0 standard drinks of alcohol per week. She reports that she does not use drugs. She is a Copywriter, advertising.    Past Medical History:  Diagnosis Date   Abnormal Pap smear    Hx colposcopy and LEEP procedure 2010--Pos HR HPV, 05-06-2018 LGSIL HPV HR+   Migraines    Migraines as a teenager with aura   Seasonal allergies    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    Past Surgical History:  Procedure Laterality Date   BREAST SURGERY     breast reduction   CERVICAL BIOPSY  W/ LOOP ELECTRODE EXCISION  2010   colposcopy and LEEP     LAPAROSCOPIC APPENDECTOMY N/A 11/16/2020   Procedure: APPENDECTOMY LAPAROSCOPIC;  Surgeon: Autumn Messing III, MD;  Location: [facility];  Service: General;  Laterality: N/A;    Current Outpatient Medications  Medication Sig Dispense Refill   norethindrone (MICRONOR) 0.35 MG tablet Take 1 tablet (0.35 mg total) by mouth daily. 84 tablet 4   No current  facility-administered medications for this visit.    Family History  Problem Relation Age of Onset   Arthritis Mother    Diabetes Mother    Hyperlipidemia Mother    Osteoarthritis Mother    Heart disease Maternal Grandmother    Cancer Maternal Grandfather    Mental illness Paternal Grandmother    Heart disease Paternal Grandfather     Review of Systems  All other systems reviewed and are negative.   Exam:   BP 122/78 (BP Location: Left Arm, Patient Position: Sitting, Cuff Size: Normal)   Pulse 81   Ht 5' (1.524 m)   Wt 145 lb (65.8 kg)   SpO2 96%   BMI 28.32 kg/m   Weight change: @WEIGHTCHANGE @ Height:   Height: 5' (152.4 cm)  Ht Readings from Last 3 Encounters:  05/20/22 5' (1.524 m)  05/19/21 5' (1.524 m)  11/16/20 5' (1.524 m)    General appearance: alert, cooperative and appears stated age Head: Normocephalic, without obvious abnormality, atraumatic Neck: no adenopathy, supple, symmetrical, trachea midline and thyroid normal to inspection and palpation Lungs: clear to auscultation bilaterally Cardiovascular: regular rate and rhythm Breasts: normal appearance, no masses or tenderness Abdomen: soft, non-tender; non distended,  no masses,  no organomegaly Extremities:  extremities normal, atraumatic, no cyanosis or edema Skin: Skin color, texture, turgor normal. No rashes or lesions Lymph nodes: Cervical, supraclavicular, and axillary nodes normal. No abnormal inguinal nodes palpated Neurologic: Grossly normal   Pelvic: External genitalia:  no lesions              Urethra:  normal appearing urethra with no masses, tenderness or lesions              Bartholins and Skenes: normal                 Vagina: normal appearing vagina with normal color and discharge, no lesions              Cervix: no lesions and stenotic               Bimanual Exam:  Uterus:  normal size, contour, position, consistency, mobility, non-tender              Adnexa: no mass, fullness,  tenderness               Rectovaginal: Confirms               Anus:  normal sphincter tone, no lesions  Gae Dry, CMA chaperoned for the exam.  1. Well woman exam Discussed breast self exam Discussed calcium and vit D intake Mammogram is scheduled Colon cancer screening next year  2. Screening for cervical cancer - Cytology - PAP  3. Encounter for surveillance of contraceptive pills Doing well - norethindrone (MICRONOR) 0.35 MG tablet; Take 1 tablet (0.35 mg total) by mouth daily.  Dispense: 84 tablet; Refill: 4

## 2022-05-20 ENCOUNTER — Ambulatory Visit (INDEPENDENT_AMBULATORY_CARE_PROVIDER_SITE_OTHER): Payer: 59 | Admitting: Obstetrics and Gynecology

## 2022-05-20 ENCOUNTER — Encounter: Payer: Self-pay | Admitting: Obstetrics and Gynecology

## 2022-05-20 ENCOUNTER — Other Ambulatory Visit: Payer: Self-pay | Admitting: Obstetrics and Gynecology

## 2022-05-20 ENCOUNTER — Other Ambulatory Visit (HOSPITAL_COMMUNITY)
Admission: RE | Admit: 2022-05-20 | Discharge: 2022-05-20 | Disposition: A | Discharge status: Home / Self Care | Payer: 59 | Source: Ambulatory Visit | Attending: Obstetrics and Gynecology | Admitting: Obstetrics and Gynecology

## 2022-05-20 VITALS — BP 122/78 | HR 81 | Ht 60.0 in | Wt 145.0 lb

## 2022-05-20 DIAGNOSIS — Z124 Encounter for screening for malignant neoplasm of cervix: Secondary | ICD-10-CM | POA: Diagnosis present

## 2022-05-20 DIAGNOSIS — Z01419 Encounter for gynecological examination (general) (routine) without abnormal findings: Secondary | ICD-10-CM

## 2022-05-20 DIAGNOSIS — Z3041 Encounter for surveillance of contraceptive pills: Secondary | ICD-10-CM | POA: Diagnosis not present

## 2022-05-20 DIAGNOSIS — Z1231 Encounter for screening mammogram for malignant neoplasm of breast: Secondary | ICD-10-CM

## 2022-05-20 MED ORDER — NORETHINDRONE 0.35 MG PO TABS: 1.0000 | ORAL_TABLET | Freq: Every day | ORAL | 4 refills | Status: DC

## 2022-05-27 LAB — CYTOLOGY - PAP
Adequacy: ABSENT
Comment: NEGATIVE
Diagnosis: NEGATIVE
High risk HPV: NEGATIVE

## 2022-06-05 ENCOUNTER — Ambulatory Visit: Payer: 59

## 2022-06-19 ENCOUNTER — Ambulatory Visit
Admission: RE | Admit: 2022-06-19 | Discharge: 2022-06-19 | Disposition: A | Discharge status: Home / Self Care | Payer: 59 | Source: Ambulatory Visit | Attending: Obstetrics and Gynecology | Admitting: Obstetrics and Gynecology

## 2022-06-19 DIAGNOSIS — Z1231 Encounter for screening mammogram for malignant neoplasm of breast: Secondary | ICD-10-CM

## 2023-06-18 ENCOUNTER — Encounter (HOSPITAL_BASED_OUTPATIENT_CLINIC_OR_DEPARTMENT_OTHER): Payer: Self-pay | Admitting: Emergency Medicine

## 2023-06-18 ENCOUNTER — Other Ambulatory Visit: Payer: Self-pay

## 2023-06-18 ENCOUNTER — Emergency Department (HOSPITAL_BASED_OUTPATIENT_CLINIC_OR_DEPARTMENT_OTHER)
Admission: EM | Admit: 2023-06-18 | Discharge: 2023-06-18 | Disposition: A | Discharge status: Home / Self Care | Payer: 59 | Attending: Emergency Medicine | Admitting: Emergency Medicine

## 2023-06-18 ENCOUNTER — Emergency Department (HOSPITAL_BASED_OUTPATIENT_CLINIC_OR_DEPARTMENT_OTHER): Payer: 59 | Admitting: Radiology

## 2023-06-18 DIAGNOSIS — R079 Chest pain, unspecified: Secondary | ICD-10-CM | POA: Insufficient documentation

## 2023-06-18 LAB — TROPONIN I (HIGH SENSITIVITY): Troponin I (High Sensitivity): <2 ng/L (ref <18)

## 2023-06-18 LAB — PREGNANCY, URINE: Preg Test, Ur: NEGATIVE

## 2023-06-18 LAB — CBC
HCT: 40.3 % (ref 36.0–46.0)
Hemoglobin: 13.3 g/dL (ref 12.0–15.0)
MCH: 30.9 pg (ref 26.0–34.0)
MCHC: 33 g/dL (ref 30.0–36.0)
MCV: 93.5 fL (ref 80.0–100.0)
Platelets: 272 10*3/uL (ref 150–400)
RBC: 4.31 MIL/uL (ref 3.87–5.11)
RDW: 12.5 % (ref 11.5–15.5)
WBC: 4.6 10*3/uL (ref 4.0–10.5)
nRBC: 0 % (ref 0.0–0.2)

## 2023-06-18 LAB — BASIC METABOLIC PANEL
Anion gap: 6 (ref 5–15)
BUN: 11 mg/dL (ref 6–20)
CO2: 23 mmol/L (ref 22–32)
Calcium: 9 mg/dL (ref 8.9–10.3)
Chloride: 106 mmol/L (ref 98–111)
Creatinine, Ser: 0.67 mg/dL (ref 0.44–1.00)
GFR, Estimated: >60 mL/min (ref >60)
Glucose, Bld: 116 mg/dL — ABNORMAL HIGH (ref 70–99)
Potassium: 4.8 mmol/L (ref 3.5–5.1)
Sodium: 135 mmol/L (ref 135–145)

## 2023-06-18 LAB — D-DIMER, QUANTITATIVE: D-Dimer, Quant: 0.46 ug{FEU}/mL (ref 0.00–0.50)

## 2023-06-18 MED ORDER — KETOROLAC TROMETHAMINE 15 MG/ML IJ SOLN
15.0000 mg | Freq: Once | INTRAMUSCULAR | Status: AC
  Administered 2023-06-18: 15 mg via INTRAVENOUS
  Filled 2023-06-18: qty 1

## 2023-06-18 NOTE — ED Triage Notes (Signed)
C/o left sided cp that rads into back  x 1 week. Denies cardiac hx.

## 2023-06-18 NOTE — Discharge Instructions (Addendum)
While you were in the emergency room you had blood work done to look for signs of injury to your heart.  These tests were normal.  You had an EKG done that looks at the electrical activity of your heart and this was normal.  Your chest x-ray was also normal.  At this time, I do not of a clear cause for your chest pain, however does not appear to be emergency at this time.  I would like you to follow-up with your primary care doctor next week to discuss the symptoms.  Return to the emergency room if you develop any new or worsening pain in your chest.

## 2023-06-18 NOTE — ED Provider Notes (Signed)
[place] EMERGENCY DEPARTMENT AT [facility] Provider Note   CSN: [id number] Arrival date & time: 06/18/23  4098     History  Chief Complaint  Patient presents with   Chest Pain    Marisa Hawkins is a 48 y.o. female.  This is a 48 year old female is here today for 1 week of left-sided chest pain.  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  She notices if she moves in a certain direction the pain gets worse.  Patient reports that her father just had bypass surgery.  She does take an oral contraceptive.   Chest Pain      Home Medications Prior to Admission medications   Medication Sig Start Date End Date Taking? Authorizing Provider  norethindrone (MICRONOR) 0.35 MG tablet Take 1 tablet (0.35 mg total) by mouth daily. 05/20/22   Romualdo Bolk, MD      Allergies    Patient has no known allergies.    Review of Systems   Review of Systems  Cardiovascular:  Positive for chest pain.    Physical Exam Updated Vital Signs BP 134/76 (BP Location: Left Arm)   Pulse 92   Temp 98.5 F (36.9 C) (Oral)   Resp 18   Ht 5' (1.524 m)   Wt 65.8 kg   SpO2 99%   BMI 28.32 kg/m  Physical Exam Vitals reviewed.  Cardiovascular:     Rate and Rhythm: Normal rate.     Heart sounds: Normal heart sounds. No murmur heard. Pulmonary:     Effort: Pulmonary effort is normal. No tachypnea.     Breath sounds: Normal breath sounds.  Musculoskeletal:     Cervical back: Normal range of motion.     Right lower leg: No edema.     Left lower leg: No edema.  Skin:    General: Skin is warm.  Neurological:     Mental Status: She is alert.     ED Results / Procedures / Treatments   Labs (all labs ordered are listed, but only abnormal results are displayed) Labs Reviewed  BASIC METABOLIC PANEL - Abnormal; Notable  for the following components:      Result Value   Glucose, Bld 116 (*)    All other components within normal limits  CBC  PREGNANCY, URINE  D-DIMER, QUANTITATIVE  TROPONIN I (HIGH SENSITIVITY)    EKG None  Radiology DG Chest 2 View Result Date: 06/18/2023 CLINICAL DATA:  48 year old female with left side chest pain radiating to the back for 1 week. EXAM: CHEST - 2 VIEW COMPARISON:  None Available. FINDINGS: Normal lung volumes and mediastinal contours. Visualized tracheal air column is within normal limits. Both lungs appear clear. No pneumothorax or pleural effusion. Negative visible bowel gas and osseous structures. IMPRESSION: Negative.  No cardiopulmonary abnormality. Electronically Signed   By: Odessa Fleming M.D.   On: 06/18/2023 09:08    Procedures Procedures    Medications Ordered in ED Medications  ketorolac (TORADOL) 15 MG/ML injection 15 mg (has no administration in time range)    ED Course/ Medical Decision Making/ A&P                                 Medical Decision Making 48 year old female  who is here today for 1 week of chest pain.  Differential diagnoses include ACS, PE, costochondritis, pleurisy, less likely pneumonia, less likely dissection.  Plan -my independent review the patient's EKG shows no ST segment depressions or elevations, no T wave versions, no evidence of acute ischemia.  Patient risk factors include only family history.  Overall looks well at this time.  No reproducible chest wall pain.  With her being on OCPs, did order a D-dimer which was negative.  Her initial troponin was less than 2.  With her duration of symptoms, do not believe a delta troponin is necessary.  Provided patient with some Toradol, which did relieve her symptoms a bit.  Will discharge patient, have her follow-up with her PCP.  Return precautions discussed at bedside.  Amount and/or Complexity of Data Reviewed Labs: ordered. Radiology: ordered.  Risk Prescription drug  management.          Final Clinical Impression(s) / ED Diagnoses Final diagnoses:  None    Rx / DC Orders ED Discharge Orders     None         Arletha Pili, DO 06/18/23 1045

## 2023-07-02 ENCOUNTER — Other Ambulatory Visit (HOSPITAL_BASED_OUTPATIENT_CLINIC_OR_DEPARTMENT_OTHER): Payer: Self-pay | Admitting: Family Medicine

## 2023-07-02 DIAGNOSIS — Z8249 Family history of ischemic heart disease and other diseases of the circulatory system: Secondary | ICD-10-CM

## 2023-07-06 ENCOUNTER — Other Ambulatory Visit: Payer: Self-pay

## 2023-07-06 DIAGNOSIS — Z3041 Encounter for surveillance of contraceptive pills: Secondary | ICD-10-CM

## 2023-07-06 MED ORDER — NORETHINDRONE 0.35 MG PO TABS: 1.0000 | ORAL_TABLET | Freq: Every day | ORAL | 0 refills | Status: DC

## 2023-07-06 NOTE — Telephone Encounter (Signed)
 Med refill request: Micronor 0.35 mg Last AEX: 05/20/22 Dr. Oscar La Next AEX: none scheduled Last MMG (if hormonal med) 06/19/22 BI-RADS 1 negative Refill authorized: Micronor 0.35 mg #28.  Needs appointment.  Sent to provider for review.

## 2023-07-07 LAB — COLOGUARD: COLOGUARD: NEGATIVE

## 2023-07-07 LAB — EXTERNAL GENERIC LAB PROCEDURE: COLOGUARD: NEGATIVE

## 2023-08-07 ENCOUNTER — Other Ambulatory Visit: Payer: Self-pay | Admitting: Radiology

## 2023-08-07 DIAGNOSIS — Z3041 Encounter for surveillance of contraceptive pills: Secondary | ICD-10-CM

## 2023-08-09 NOTE — Telephone Encounter (Signed)
 Med refill request: Marisa Hawkins  Last AEX: 05/20/22 Next AEX: not scheduled , message sent to FD  Last MMG (if hormonal med) 06/19/22 birads cat 1 neg  Refill authorized: last rx 07/06/23, #28 with 0 refills. Please advise

## 2023-09-10 ENCOUNTER — Other Ambulatory Visit: Payer: Self-pay | Admitting: Radiology

## 2023-09-10 DIAGNOSIS — Z3041 Encounter for surveillance of contraceptive pills: Secondary | ICD-10-CM

## 2023-09-10 NOTE — Telephone Encounter (Signed)
 Med refill request: Marisa Hawkins   Last AEX: 05/20/2022 JJ Next AEX: not scheduled, sent message to schedule annual visit Last MMG (if hormonal med) 06/19/22 Refill authorized: Last Rx sent #28 tab with zero refills on 08/10/23. Please approve or deny as appropriate.

## 2023-10-08 ENCOUNTER — Ambulatory Visit: Admitting: Obstetrics and Gynecology

## 2024-05-01 ENCOUNTER — Encounter (HOSPITAL_BASED_OUTPATIENT_CLINIC_OR_DEPARTMENT_OTHER): Payer: Self-pay

## 2024-05-01 ENCOUNTER — Emergency Department (HOSPITAL_BASED_OUTPATIENT_CLINIC_OR_DEPARTMENT_OTHER)

## 2024-05-01 ENCOUNTER — Emergency Department (HOSPITAL_BASED_OUTPATIENT_CLINIC_OR_DEPARTMENT_OTHER)
Admission: EM | Admit: 2024-05-01 | Discharge: 2024-05-01 | Disposition: A | Discharge status: Home / Self Care | Attending: Emergency Medicine | Admitting: Emergency Medicine

## 2024-05-01 ENCOUNTER — Other Ambulatory Visit: Payer: Self-pay

## 2024-05-01 DIAGNOSIS — R1010 Upper abdominal pain, unspecified: Secondary | ICD-10-CM | POA: Diagnosis not present

## 2024-05-01 DIAGNOSIS — R0789 Other chest pain: Secondary | ICD-10-CM | POA: Insufficient documentation

## 2024-05-01 LAB — CBC WITH DIFFERENTIAL/PLATELET
Abs Immature Granulocytes: 0.02 K/uL (ref 0.00–0.07)
Basophils Absolute: 0.1 K/uL (ref 0.0–0.1)
Basophils Relative: 1 %
Eosinophils Absolute: 0 K/uL (ref 0.0–0.5)
Eosinophils Relative: 0 %
HCT: 39.5 % (ref 36.0–46.0)
Hemoglobin: 13.3 g/dL (ref 12.0–15.0)
Immature Granulocytes: 0 %
Lymphocytes Relative: 17 %
Lymphs Abs: 1.3 K/uL (ref 0.7–4.0)
MCH: 31.7 pg (ref 26.0–34.0)
MCHC: 33.7 g/dL (ref 30.0–36.0)
MCV: 94.3 fL (ref 80.0–100.0)
Monocytes Absolute: 0.5 K/uL (ref 0.1–1.0)
Monocytes Relative: 6 %
Neutro Abs: 5.6 K/uL (ref 1.7–7.7)
Neutrophils Relative %: 76 %
Platelets: 261 K/uL (ref 150–400)
RBC: 4.19 MIL/uL (ref 3.87–5.11)
RDW: 12.7 % (ref 11.5–15.5)
WBC: 7.4 K/uL (ref 4.0–10.5)
nRBC: 0 % (ref 0.0–0.2)

## 2024-05-01 LAB — COMPREHENSIVE METABOLIC PANEL WITH GFR
ALT: 26 U/L (ref 0–44)
AST: 20 U/L (ref 15–41)
Albumin: 4.6 g/dL (ref 3.5–5.0)
Alkaline Phosphatase: 56 U/L (ref 38–126)
Anion gap: 14 (ref 5–15)
BUN: 8 mg/dL (ref 6–20)
CO2: 23 mmol/L (ref 22–32)
Calcium: 9.7 mg/dL (ref 8.9–10.3)
Chloride: 104 mmol/L (ref 98–111)
Creatinine, Ser: 0.71 mg/dL (ref 0.44–1.00)
GFR, Estimated: >60 mL/min
Glucose, Bld: 98 mg/dL (ref 70–99)
Potassium: 3.5 mmol/L (ref 3.5–5.1)
Sodium: 140 mmol/L (ref 135–145)
Total Bilirubin: 0.4 mg/dL (ref 0.0–1.2)
Total Protein: 7.7 g/dL (ref 6.5–8.1)

## 2024-05-01 LAB — URINALYSIS, ROUTINE W REFLEX MICROSCOPIC
Bilirubin Urine: NEGATIVE
Glucose, UA: NEGATIVE mg/dL
Ketones, ur: NEGATIVE mg/dL
Leukocytes,Ua: NEGATIVE
Nitrite: NEGATIVE
Protein, ur: NEGATIVE mg/dL
Specific Gravity, Urine: 1.01 (ref 1.005–1.030)
pH: 6 (ref 5.0–8.0)

## 2024-05-01 LAB — LIPASE, BLOOD: Lipase: 27 U/L (ref 11–51)

## 2024-05-01 LAB — URINALYSIS, MICROSCOPIC (REFLEX): Bacteria, UA: NONE SEEN

## 2024-05-01 LAB — PREGNANCY, URINE: Preg Test, Ur: NEGATIVE

## 2024-05-01 MED ORDER — IOHEXOL 300 MG/ML  SOLN
100.0000 mL | Freq: Once | INTRAMUSCULAR | Status: AC | PRN
  Administered 2024-05-01: 100 mL via INTRAVENOUS

## 2024-05-01 NOTE — ED Provider Triage Note (Signed)
 Emergency Medicine Provider Triage Evaluation Note  Marisa Hawkins , a 49 y.o. female  was evaluated in triage.  Pt complains of left side abdominal pain onset 04/27/24, worse with rolling over in bed, coughing, tender to the touch, pain is constant, improves with holding still. No vomiting, changes in bowel or bladder habits, fevers. Took IBU over the weekend, symptoms not improving so came to the ER. LMP Friday.  No prior colonoscopy. Did cologuard 11 months ago.  Review of Systems  Positive:  Negative:   Physical Exam  There were no vitals taken for this visit. Gen:   Awake, no distress   Resp:  Normal effort  MSK:   Moves extremities without difficulty  Other:  LUQ TTP  Medical Decision Making  Medically screening exam initiated at 4:03 PM.  Appropriate orders placed.  Kyrielle Urbanski was informed that the remainder of the evaluation will be completed by another provider, this initial triage assessment does not replace that evaluation, and the importance of remaining in the ED until their evaluation is complete.     Beverley Leita LABOR, PA-C 05/01/24 1606

## 2024-05-01 NOTE — ED Provider Notes (Signed)
 " [place] EMERGENCY DEPARTMENT AT [facility] Provider Note   CSN: [id number] Arrival date & time: 05/01/24  1542     Patient presents with: Abdominal Pain   Marisa Hawkins is a 49 y.o. female.   Patient is a 49 year old female with a history of migraines and seasonal allergies who is presenting today with left-sided lower chest pain and upper abdominal pain.  This started approximately 4 days ago.  It is worse with movement, palpation and with really deep breathing.  She has not had any cough or congestion.  No fevers.  She does report that she has not quite been as hungry as usual but the pain is not worse with eating.  She has not had any nausea, vomiting, diarrhea.  She does use oral contraceptives but denies any unilateral leg pain or swelling.  Also does not use excessive alcohol and denies any known trauma.  Ibuprofen does help the pain.  The history is provided by the patient.  Abdominal Pain      Prior to Admission medications  Medication Sig Start Date End Date Taking? Authorizing Provider  HEATHER  0.35 MG tablet TAKE 1 TABLET BY MOUTH DAILY 08/10/23   Chrzanowski, Jami B, NP    Allergies: Patient has no known allergies.    Review of Systems  Gastrointestinal:  Positive for abdominal pain.    Updated Vital Signs BP (!) 138/106 (BP Location: Right Arm)   Pulse (!) 103   Temp 97.9 F (36.6 C)   Resp 17   Ht 5' (1.524 m)   Wt 66.2 kg   SpO2 100%   BMI 28.51 kg/m   Physical Exam Vitals and nursing note reviewed.  Constitutional:      General: She is not in acute distress.    Appearance: She is well-developed.  HENT:     Head: Normocephalic and atraumatic.  Eyes:     Pupils: Pupils are equal, round, and reactive to light.  Cardiovascular:     Rate and Rhythm: Normal rate and regular rhythm.     Heart sounds: Normal heart sounds. No murmur heard.    No friction rub.  Pulmonary:     Effort: Pulmonary effort is normal.     Breath sounds: Normal  breath sounds. No wheezing or rales.  Chest:     Chest wall: Tenderness present.       Comments: Mild tenderness with palpation of the lower rib Abdominal:     General: Bowel sounds are normal. There is no distension.     Palpations: Abdomen is soft.     Tenderness: There is no abdominal tenderness. There is no guarding or rebound.     Comments: Abdomen is soft with no pain  Musculoskeletal:        General: No tenderness. Normal range of motion.     Comments: No edema  Skin:    General: Skin is warm and dry.     Findings: No rash.  Neurological:     Mental Status: She is alert and oriented to person, place, and time. Mental status is at baseline.     Cranial Nerves: No cranial nerve deficit.  Psychiatric:        Behavior: Behavior normal.     (all labs ordered are listed, but only abnormal results are displayed) Labs Reviewed  URINALYSIS, ROUTINE W REFLEX MICROSCOPIC - Abnormal; Notable for the following components:      Result Value   Color, Urine STRAW (*)    Hgb urine  dipstick TRACE (*)    All other components within normal limits  CBC WITH DIFFERENTIAL/PLATELET  COMPREHENSIVE METABOLIC PANEL WITH GFR  LIPASE, BLOOD  PREGNANCY, URINE  URINALYSIS, MICROSCOPIC (REFLEX)    EKG: None  Radiology: CT ABDOMEN PELVIS W CONTRAST Result Date: 05/01/2024 EXAM: CT ABDOMEN AND PELVIS WITH CONTRAST 05/01/2024 05:24:00 PM TECHNIQUE: CT of the abdomen and pelvis was performed with the administration of 100 mL of iohexol  (OMNIPAQUE ) 300 MG/ML solution. Multiplanar reformatted images are provided for review. Automated exposure control, iterative reconstruction, and/or weight-based adjustment of the mA/kV was utilized to reduce the radiation dose to as low as reasonably achievable. COMPARISON: 11/16/2020 CLINICAL HISTORY: LLQ abdominal pain. FINDINGS: LOWER CHEST: No acute abnormality. LIVER: The liver is unremarkable. GALLBLADDER AND BILE DUCTS: Gallbladder is unremarkable. No biliary  ductal dilatation. SPLEEN: No acute abnormality. PANCREAS: No acute abnormality. ADRENAL GLANDS: No acute abnormality. KIDNEYS, URETERS AND BLADDER: No stones in the kidneys or ureters. No hydronephrosis. No perinephric or periureteral stranding. Urinary bladder is unremarkable. GI AND BOWEL: Stomach demonstrates no acute abnormality. Descending and sigmoid colonic diverticulosis. No changes of acute diverticulitis. There is no bowel obstruction. PERITONEUM AND RETROPERITONEUM: No ascites. No free air. No free pelvic fluid. VASCULATURE: Aorta is normal in caliber. LYMPH NODES: No lymphadenopathy. REPRODUCTIVE ORGANS: Dominant follicle in the left ovary measuring 1.7 cm. BONES AND SOFT TISSUES: Appendectomy. No acute osseous abnormality. No focal soft tissue abnormality. IMPRESSION: 1. No acute findings in the abdomen or pelvis. 2. Descending and sigmoid colonic diverticulosis without evidence of diverticulitis. 3. Dominant follicle in the left ovary measuring 1.7 cm. . Electronically signed by: Rogelia Myers MD 05/01/2024 07:05 PM EST RP Workstation: [id number]     Procedures   Medications Ordered in the ED  iohexol  (OMNIPAQUE ) 300 MG/ML solution 100 mL (100 mLs Intravenous Contrast Given 05/01/24 1719)                                    Medical Decision Making Amount and/or Complexity of Data Reviewed Labs: ordered. Decision-making details documented in ED Course. Radiology: ordered and independent interpretation performed. Decision-making details documented in ED Course.   Pt presenting today with a complaint that caries a high risk for morbidity and mortality. Here today with complaint of left lower chest pain.  Patient reports that she is having no other associated symptoms.  She is well-appearing on exam.  100% oxygen saturation.  Patient does have a risk factor of taking oral contraceptives but otherwise has no other risk factors for clot.  Low suspicion that this is a PE today or acute  cardiac cause.  She also has no urinary complaints or flank tenderness.  I independently interpreted patient's labs .  UPT, UA are within normal limits, CBC, CMP and lipase are normal.  Pain is worse with movement. Discussed all this with the patient.  Discussed her risk factor of taking OCPs and with shared decision making at this time we will treat for musculoskeletal pain.  If symptoms get worse she will return for PE testing.  She will continue NSAIDs.     Final diagnoses:  Chest wall pain    ED Discharge Orders     None          Doretha Folks, MD 05/01/24 2019  "

## 2024-05-01 NOTE — Discharge Instructions (Addendum)
 Blood work and imaging was normal today.  Most likely this is muscular pain and you can continue taking ibuprofen.  If it does get worse or you start having shortness of breath, fever or other symptoms return to the emergency room.

## 2024-05-01 NOTE — ED Triage Notes (Signed)
 Arrives ambulatory to the ED with complaints of worsening left side (upper) abdominal pain x2 weeks.
